# Patient Record
Sex: Female | Born: 1940 | Race: White | Hispanic: No | Smoking: Never smoker
Health system: Southern US, Community
[De-identification: ages and names within clinical notes are randomized; demographics above are authoritative.]

## PROBLEM LIST (undated history)

## (undated) DIAGNOSIS — C801 Malignant (primary) neoplasm, unspecified: Secondary | ICD-10-CM

## (undated) DIAGNOSIS — E041 Nontoxic single thyroid nodule: Secondary | ICD-10-CM

## (undated) DIAGNOSIS — M25569 Pain in unspecified knee: Secondary | ICD-10-CM

## (undated) DIAGNOSIS — M1812 Unilateral primary osteoarthritis of first carpometacarpal joint, left hand: Secondary | ICD-10-CM

## (undated) DIAGNOSIS — K219 Gastro-esophageal reflux disease without esophagitis: Secondary | ICD-10-CM

## (undated) DIAGNOSIS — I700 Atherosclerosis of aorta: Secondary | ICD-10-CM

## (undated) DIAGNOSIS — M65342 Trigger finger, left ring finger: Secondary | ICD-10-CM

## (undated) DIAGNOSIS — K819 Cholecystitis, unspecified: Secondary | ICD-10-CM

## (undated) DIAGNOSIS — M67431 Ganglion, right wrist: Secondary | ICD-10-CM

## (undated) DIAGNOSIS — R1310 Dysphagia, unspecified: Secondary | ICD-10-CM

## (undated) DIAGNOSIS — R918 Other nonspecific abnormal finding of lung field: Secondary | ICD-10-CM

## (undated) DIAGNOSIS — Z8543 Personal history of malignant neoplasm of ovary: Secondary | ICD-10-CM

## (undated) DIAGNOSIS — E278 Other specified disorders of adrenal gland: Secondary | ICD-10-CM

## (undated) DIAGNOSIS — R10819 Abdominal tenderness, unspecified site: Secondary | ICD-10-CM

## (undated) DIAGNOSIS — M170 Bilateral primary osteoarthritis of knee: Secondary | ICD-10-CM

## (undated) DIAGNOSIS — K760 Fatty (change of) liver, not elsewhere classified: Secondary | ICD-10-CM

## (undated) DIAGNOSIS — R002 Palpitations: Secondary | ICD-10-CM

## (undated) DIAGNOSIS — R109 Unspecified abdominal pain: Secondary | ICD-10-CM

## (undated) DIAGNOSIS — K5900 Constipation, unspecified: Secondary | ICD-10-CM

## (undated) DIAGNOSIS — E039 Hypothyroidism, unspecified: Secondary | ICD-10-CM

## (undated) DIAGNOSIS — I10 Essential (primary) hypertension: Secondary | ICD-10-CM

## (undated) DIAGNOSIS — K623 Rectal prolapse: Secondary | ICD-10-CM

## (undated) DIAGNOSIS — M1990 Unspecified osteoarthritis, unspecified site: Secondary | ICD-10-CM

## (undated) DIAGNOSIS — D126 Benign neoplasm of colon, unspecified: Secondary | ICD-10-CM

## (undated) DIAGNOSIS — K449 Diaphragmatic hernia without obstruction or gangrene: Secondary | ICD-10-CM

## (undated) DIAGNOSIS — R1031 Right lower quadrant pain: Secondary | ICD-10-CM

## (undated) DIAGNOSIS — K439 Ventral hernia without obstruction or gangrene: Secondary | ICD-10-CM

## (undated) DIAGNOSIS — E279 Disorder of adrenal gland, unspecified: Secondary | ICD-10-CM

## (undated) DIAGNOSIS — D485 Neoplasm of uncertain behavior of skin: Secondary | ICD-10-CM

## (undated) DIAGNOSIS — E559 Vitamin D deficiency, unspecified: Secondary | ICD-10-CM

## (undated) HISTORY — DX: Ventral hernia without obstruction or gangrene: K43.9

## (undated) HISTORY — DX: Malignant (primary) neoplasm, unspecified: C80.1

## (undated) HISTORY — DX: Rectal prolapse: K62.3

## (undated) HISTORY — DX: Gastro-esophageal reflux disease without esophagitis: K21.9

## (undated) HISTORY — DX: Neoplasm of uncertain behavior of skin: D48.5

## (undated) HISTORY — DX: Trigger finger, left ring finger: M65.342

## (undated) HISTORY — DX: Ganglion, right wrist: M67.431

## (undated) HISTORY — DX: Palpitations: R00.2

## (undated) HISTORY — DX: Hypothyroidism, unspecified: E03.9

## (undated) HISTORY — DX: Atherosclerosis of aorta: I70.0

## (undated) HISTORY — DX: Pain in unspecified knee: M25.569

## (undated) HISTORY — DX: Personal history of malignant neoplasm of ovary: Z85.43

## (undated) HISTORY — DX: Unspecified osteoarthritis, unspecified site: M19.90

## (undated) HISTORY — DX: Abdominal tenderness, unspecified site: R10.819

## (undated) HISTORY — DX: Fatty (change of) liver, not elsewhere classified: K76.0

## (undated) HISTORY — DX: Dysphagia, unspecified: R13.10

## (undated) HISTORY — DX: Unspecified abdominal pain: R10.9

## (undated) HISTORY — DX: Cholecystitis, unspecified: K81.9

## (undated) HISTORY — DX: Bilateral primary osteoarthritis of knee: M17.0

## (undated) HISTORY — DX: Essential (primary) hypertension: I10

## (undated) HISTORY — DX: Other nonspecific abnormal finding of lung field: R91.8

## (undated) HISTORY — DX: Other specified disorders of adrenal gland: E27.8

## (undated) HISTORY — DX: Unilateral primary osteoarthritis of first carpometacarpal joint, left hand: M18.12

## (undated) HISTORY — DX: Disorder of adrenal gland, unspecified: E27.9

## (undated) HISTORY — DX: Diaphragmatic hernia without obstruction or gangrene: K44.9

## (undated) HISTORY — DX: Nontoxic single thyroid nodule: E04.1

## (undated) HISTORY — DX: Vitamin D deficiency, unspecified: E55.9

## (undated) HISTORY — DX: Benign neoplasm of colon, unspecified: D12.6

## (undated) HISTORY — DX: Right lower quadrant pain: R10.31

## (undated) HISTORY — DX: Constipation, unspecified: K59.00

---

## 1989-03-22 HISTORY — PX: ABDOMINAL HYSTERECTOMY: SHX81

## 2000-01-15 ENCOUNTER — Encounter: Admission: RE | Admit: 2000-01-15 | Discharge: 2000-01-15 | Payer: Self-pay | Admitting: Family Medicine

## 2000-01-15 ENCOUNTER — Encounter: Payer: Self-pay | Admitting: Family Medicine

## 2000-02-13 ENCOUNTER — Ambulatory Visit (HOSPITAL_COMMUNITY): Admission: RE | Admit: 2000-02-13 | Discharge: 2000-02-13 | Payer: Self-pay | Admitting: Gastroenterology

## 2000-02-13 ENCOUNTER — Encounter: Payer: Self-pay | Admitting: Gastroenterology

## 2001-01-19 ENCOUNTER — Encounter: Payer: Self-pay | Admitting: Family Medicine

## 2001-01-19 ENCOUNTER — Encounter: Admission: RE | Admit: 2001-01-19 | Discharge: 2001-01-19 | Payer: Self-pay | Admitting: Family Medicine

## 2001-02-25 ENCOUNTER — Encounter: Admission: RE | Admit: 2001-02-25 | Discharge: 2001-02-25 | Payer: Self-pay | Admitting: Family Medicine

## 2001-02-25 ENCOUNTER — Encounter: Payer: Self-pay | Admitting: Family Medicine

## 2001-07-14 ENCOUNTER — Ambulatory Visit (HOSPITAL_COMMUNITY): Admission: RE | Admit: 2001-07-14 | Discharge: 2001-07-14 | Payer: Self-pay | Admitting: Gastroenterology

## 2002-12-21 HISTORY — PX: HERNIA REPAIR: SHX51

## 2002-12-24 ENCOUNTER — Encounter: Payer: Self-pay | Admitting: General Surgery

## 2002-12-28 ENCOUNTER — Encounter: Payer: Self-pay | Admitting: Family Medicine

## 2002-12-28 ENCOUNTER — Encounter: Admission: RE | Admit: 2002-12-28 | Discharge: 2002-12-28 | Payer: Self-pay | Admitting: Family Medicine

## 2002-12-29 ENCOUNTER — Ambulatory Visit (HOSPITAL_COMMUNITY): Admission: RE | Admit: 2002-12-29 | Discharge: 2002-12-29 | Payer: Self-pay | Admitting: General Surgery

## 2003-07-19 ENCOUNTER — Encounter: Admission: RE | Admit: 2003-07-19 | Discharge: 2003-07-19 | Payer: Self-pay | Admitting: Family Medicine

## 2003-07-21 ENCOUNTER — Encounter: Admission: RE | Admit: 2003-07-21 | Discharge: 2003-07-21 | Payer: Self-pay | Admitting: Family Medicine

## 2003-07-23 HISTORY — PX: OOPHORECTOMY: SHX86

## 2003-07-27 ENCOUNTER — Ambulatory Visit: Admission: RE | Admit: 2003-07-27 | Discharge: 2003-07-27 | Payer: Self-pay | Admitting: Gynecology

## 2003-08-02 ENCOUNTER — Inpatient Hospital Stay (HOSPITAL_COMMUNITY): Admission: RE | Admit: 2003-08-02 | Discharge: 2003-08-05 | Payer: Self-pay | Admitting: Gynecology

## 2003-08-02 ENCOUNTER — Encounter (INDEPENDENT_AMBULATORY_CARE_PROVIDER_SITE_OTHER): Payer: Self-pay | Admitting: Specialist

## 2003-08-09 ENCOUNTER — Ambulatory Visit: Admission: RE | Admit: 2003-08-09 | Discharge: 2003-08-09 | Payer: Self-pay | Admitting: Gynecology

## 2003-09-21 ENCOUNTER — Ambulatory Visit: Admission: RE | Admit: 2003-09-21 | Discharge: 2003-09-21 | Payer: Self-pay | Admitting: Gynecology

## 2003-12-04 ENCOUNTER — Emergency Department (HOSPITAL_COMMUNITY): Admission: EM | Admit: 2003-12-04 | Discharge: 2003-12-04 | Payer: Self-pay | Admitting: Emergency Medicine

## 2003-12-20 ENCOUNTER — Ambulatory Visit: Admission: RE | Admit: 2003-12-20 | Discharge: 2003-12-20 | Payer: Self-pay | Admitting: Gynecology

## 2003-12-30 ENCOUNTER — Encounter: Admission: RE | Admit: 2003-12-30 | Discharge: 2003-12-30 | Payer: Self-pay | Admitting: Family Medicine

## 2004-12-31 ENCOUNTER — Encounter: Admission: RE | Admit: 2004-12-31 | Discharge: 2004-12-31 | Payer: Self-pay | Admitting: Family Medicine

## 2005-09-03 ENCOUNTER — Encounter: Admission: RE | Admit: 2005-09-03 | Discharge: 2005-09-03 | Payer: Self-pay | Admitting: Family Medicine

## 2005-12-31 ENCOUNTER — Encounter: Admission: RE | Admit: 2005-12-31 | Discharge: 2005-12-31 | Payer: Self-pay | Admitting: Family Medicine

## 2006-01-17 ENCOUNTER — Encounter: Admission: RE | Admit: 2006-01-17 | Discharge: 2006-01-17 | Payer: Self-pay | Admitting: Family Medicine

## 2007-01-02 ENCOUNTER — Encounter: Admission: RE | Admit: 2007-01-02 | Discharge: 2007-01-02 | Payer: Self-pay | Admitting: Family Medicine

## 2008-08-05 ENCOUNTER — Ambulatory Visit: Admission: RE | Admit: 2008-08-05 | Discharge: 2008-08-05 | Payer: Self-pay | Admitting: Gynecology

## 2009-05-17 ENCOUNTER — Encounter: Admission: RE | Admit: 2009-05-17 | Discharge: 2009-05-17 | Payer: Self-pay | Admitting: General Surgery

## 2009-10-30 ENCOUNTER — Encounter: Admission: RE | Admit: 2009-10-30 | Discharge: 2009-10-30 | Payer: Self-pay | Admitting: Internal Medicine

## 2010-10-19 ENCOUNTER — Other Ambulatory Visit: Payer: Self-pay | Admitting: Internal Medicine

## 2010-10-19 DIAGNOSIS — Z1231 Encounter for screening mammogram for malignant neoplasm of breast: Secondary | ICD-10-CM

## 2010-11-05 ENCOUNTER — Ambulatory Visit
Admission: RE | Admit: 2010-11-05 | Discharge: 2010-11-05 | Disposition: A | Discharge status: Home / Self Care | Payer: Medicare Other | Source: Ambulatory Visit | Attending: Internal Medicine | Admitting: Internal Medicine

## 2010-11-05 DIAGNOSIS — Z1231 Encounter for screening mammogram for malignant neoplasm of breast: Secondary | ICD-10-CM

## 2010-12-04 NOTE — Consult Note (Signed)
NAME:  Grace [name], Grace [name] NO.:  [id number]   MEDICAL RECORD NO.:  [id number]          PATIENT TYPE:  OUT   LOCATION:  GYN                          FACILITY:  [facility]   PHYSICIAN:  De Blanch, M.D.DATE OF BIRTH:  12-01-1940   DATE OF CONSULTATION:  08/05/2008  DATE OF DISCHARGE:  08/05/2008                                 CONSULTATION   The patient returns today for follow-up of an ovarian cancer initially  diagnosed in January 2005.  At that time she was found to have a complex  pelvic mass and a CA-125 a 48 units/mL.  She subsequently underwent  surgical resection of a stage III ovarian cancer on August 02, 2003.  No gross residual disease remained.  In follow-up, the patient was  recommended to receive carboplatin and Taxol chemotherapy as an adjunct.  She refused to take chemotherapy and was seen on several occasions  subsequently and each time did not wish to have new chemotherapy.  She  returns today for a 5-year checkup.  She was asked to get a CT scan  and a CA-125 prior to the visit, which are pending at this time.   PAST MEDICAL HISTORY:  Medical illnesses - none.   PAST SURGICAL HISTORY:  Ovarian cancer resection and staging and  debulking.   DRUG ALLERGIES:  None.   REVIEW OF SYSTEMS:  A 10-point comprehensive review of systems negative  except as noted above.   FAMILY HISTORY:  Negative for gynecologic, breast or colon cancer.   PHYSICAL EXAMINATION:  VITAL SIGNS:  Weight 172 pounds, blood pressure  130/70, pulse 80, respiratory rate 20.  GENERAL:  The patient is a healthy white female in no acute distress.  HEENT:  Negative.  NECK:  Supple without thyromegaly.  There is no supraclavicular or  inguinal adenopathy.  ABDOMEN:  Soft, nontender.  No mass, organomegaly, ascites or hernias  noted.  PELVIC:  EG, BUS, vagina, bladder and urethra are normal.  Cervix and  uterus surgically absent.  Adnexa without masses.  Rectovaginal exam  confirms.  LOWER EXTREMITIES:  Without edema or varicosities.   LABORATORY DATA:  The patient's CT scan became available at the end of  this visit and it is reported as showing no evidence of disease.  The CA-  125 value is 9.9 units/mL.   The patient seems to be clinically free of disease after having  undergone surgical resection of ovarian cancer, but did not receive any  chemotherapy.   The patient requests that she be followed by her primary physician, Dr.  Kristen Loader.  I would suggest that she have annual CA-125 values.  We would be  happy to see her back in follow-up if she wished.      De Blanch, M.D.  Electronically Signed     DC/MEDQ  D:  08/09/2008  T:  08/09/2008  Job:  [id number]   cc:   Ceasar Mons, R.N.  [address]  [place], [place] [address]

## 2010-12-07 NOTE — Op Note (Signed)
NAMEDELINA, [name] NO.:  [id number]   MEDICAL RECORD NO.:  [id number]                   PATIENT TYPE:  OIB   LOCATION:  2899                                 FACILITY:  [facility]   PHYSICIAN:  Angelia Mould. Derrell Lolling, M.D.             DATE OF BIRTH:  15-Feb-1941   DATE OF PROCEDURE:  12/29/2002  DATE OF DISCHARGE:                                 OPERATIVE REPORT   PREOPERATIVE DIAGNOSIS:  Umbilical hernia.   POSTOPERATIVE DIAGNOSIS:  Umbilical hernia.   OPERATION PERFORMED:  Umbilical herniorrhaphy.   SURGEON:  Angelia Mould. Derrell Lolling, M.D.   ANESTHESIA:  General.   INDICATIONS FOR PROCEDURE:  The patient is a 70 year old white female who  has noticed a small painful bulge at her umbilicus for about three months.  This is becoming more of an interference in her daily activities.  She has  no prior surgical history on the abdominal wall.  On exam she has a small  umbilical hernia which seems to be somewhat reducible.  The defect in the  fascia appears to be about 1 cm to 1.5 cm.  She is brought to the operating  room electively.   DESCRIPTION OF PROCEDURE:  Following the induction of general endotracheal  anesthesia, the patient's abdomen was prepped and draped in a sterile  fashion.  0.5% Marcaine with epinephrine was used as a local infiltration  anesthetic.  She was given a single dose of intravenous Ancef.  A curved  transverse incision was made at the lower rim of the umbilicus.  Dissection  was carried down through the subcutaneous tissue to the abdominal wall  fascia.  The umbilicus was dissected away, dissecting the umbilical skin  away from the hernia defect.  We debrided some fatty tissue in the area and  defined the hernia defect.  The hole in the fascia was about 1.5 cm.  There  was no evidence of intestinal involvement.  Small bleeders were controlled  with electrocautery.  I inserted my finger into the defect and felt  circumferentially and  felt no other defects.  We closed the fascial defect  with interrupted sutures of #1 Novofil.  Centrally, I placed a single suture  of #1 Novofil in a vest over pants fashion to overlap the fascia about 2 cm.  I then placed simple sutures at the corners to close the fascia at the  corners and then tied all of these sutures down.  I placed a couple of other  sutures of #1 Novofil to tack down the upper rim of the fascia to the fascia  below.  This provided a very secure overlapping repair.  Hemostasis was  excellent and achieved with electrocautery.  The wound was irrigated with  saline.  The umbilicus was tacked back to the fascia with a 3-0 Vicryl  suture.  The subcutaneous tissue was closed with interrupted sutures of 3-0  Vicryl.  The  skin was closed with a running  subcuticular suture of 4-0 Vicryl and Steri-Strips.  Clean bandages were  placed and the patient taken to the recovery room in stable condition. The  estimated blood loss was about 10mL.  Complications were none.  Sponge,  needle and instrument counts were correct.                                                Angelia Mould. Derrell Lolling, M.D.    HMI/MEDQ  D:  12/29/2002  T:  12/29/2002  Job:  [id number]   cc:   Otilio Connors. [name], M.D.  [address]  [place]  [place] [address]  Fax: [phone]

## 2010-12-07 NOTE — Op Note (Signed)
NAME:  Grace [name], Grace [name]                    ACCOUNT NO.:  [id number]   MEDICAL RECORD NO.:  [id number]                   PATIENT TYPE:  INP   LOCATION:  0457                                 FACILITY:  [facility]   PHYSICIAN:  De Blanch, M.D.         DATE OF BIRTH:  10-Apr-1941   DATE OF PROCEDURE:  08/02/2003  DATE OF DISCHARGE:                                 OPERATIVE REPORT   PREOPERATIVE DIAGNOSIS:  Complex pelvic mass.   POSTOPERATIVE DIAGNOSES:  1. Stage III-C ovarian cancer (completely debulked).  2. Retroperitoneal fibrosis.   PROCEDURE:  Radical debulking of ovarian cancer, including bilateral  salpingo-oophorectomy, omentectomy and ureterolysis.   SURGEON:  De Blanch, M.D.   ASSISTANT:  Laqueta Linden, M.D.  Telford Nab, R.N.   ANESTHESIA:  General with orotracheal tube.   ESTIMATED BLOOD LOSS:  200 cc.   SURGICAL FINDINGS:  At the time of exploratory laparotomy the upper abdomen,  including the diaphragm, liver, spleen, stomach, small and large bowel were  normal except for serosal involvement of the sigmoid colon.  The appendix  appeared normal.  There was an approximately  5 cm single solitary metastasis in the omentum.  In the pelvis a complex  cystic mass is arising from the left ovary that contained brown fluid.  The  right tube and ovary were confluent with the left tube and ovary, and  adherent to the pelvic peritoneum, vaginal cuff, and sigmoid colon mesentery  and serosa.  At completion of the surgical procedure there was no gross  residual disease, and no enlarged pelvic or periaortic lymph nodes.   DESCRIPTION OF PROCEDURE:  The patient was brought to the operating room and  after satisfactory attainment of general anesthesia, was placed in a  modified lithotomy position in S[place]eston stirrups.  The anterior abdominal wall,  perineum and vagina were prepped with Betadine, Foley catheter was placed,  the patient was draped.  The  abdomen was entered through a low midline  paramedian incision.  Peritoneal washings were obtained, then the upper  abdomen and pelvis were explored at the above-noted findings.  A Buchwalter  retractor was positioned and the omentum brought into the operative field,  with an obvious metastasis present.  Omentum was removed from its attachment  to the transverse colon, using a combination of cautery and ligatures on  vascular pedicles.  All gross omental disease is removed.  All pedicles are  inspected and found to be hemostatic.   The retractor was repositioned; the small bowel and colon were packed out of  the pelvis.  The left retroperitoneal space was opened by dividing the round  ligament and lateral sidewall peritoneum.  The vessels on the pelvic  sidewall as well as the ureter were identified.  The ovarian vessels were  skeletonized, clamped, cut, free tied and suture ligated.  The ureter was  identified and found to be densely fibrotic and adherent to the mass.  In  order to completely  excise the mass, a retroperitoneal dissection progressed  to develop the perivesical and perirectal spaces.  The ureter was then freed  from its attachments to the fibrotic retroperitoneum, performing a  ureterolysis to nearly the end of the pelvic rim to the insertion of the  ureter in the bladder.  The more distal portion of the dissection in the  uterine vessels were encountered, clamped cut and suture ligated.  With the  ureter mobilized laterally, approached the mass from the medial aspect  incising the peritoneum and the attachments of the sigmoid colon.  Likewise,  peritoneum overlying the bladder flap and vagina was incised.  In order to  gain further mobility, we opened up the right pelvic sidewall by dividing  the right round ligament and lateral peritoneum.  The pelvic sidewall  vessels and ureter were identified.  The ovarian vessels were skeletonized,  clamped, cut and suture ligated.   Careful dissection was performed beneath  the right tube and ovary, incising the posterior leaf of the broad ligament  and mobilizing the right tube and ovary to their attachments on the vaginal  cuff.  A bimanual examination was then performed in the vagina, in order to  identify the vaginal cuff.  The vaginal cuff was mobilized away from the  bladder and then grasped with the Kocher clamp.  The rectovaginal septum was  then developed, in order to further mobilize the mass away from the vaginal  cuff.  The uterosacral ligaments were isolated, clamped, cut and suture  ligated.  We continued dissection of the attachments of the mass to the  peritoneum of the sigmoid colon and its mesentery, and deep into the  posterior cul-de-sac being accomplished (using predominately cautery,  occasional suture ligature or clip as needed).  Ultimately, the left tube  and ovary and right tube and ovary were removed all as one specimen.  The  pelvis was then explored and hemostasis achieved with cautery and Hemoclips.   Proctoscopy was performed to make certain there was no leak in the sigmoid  colon, in that a bubble test was performed and no air was noted to come  through the saline placed in the pelvis.  Frozen section returned showing  this to be a malignancy of the ovary, most likely a serous carcinoma.  The  pelvis was reinspected and no lesions were noted.  The small bowel was run  from the ligament of Treitz to the cecum, and no metastases were noted on  the serosa or mesentery.  At this point in the procedure it was felt that  all gross disease had been debulked and that no further staging would be  changed by performing a lymphadenectomy.  Therefore, packs and retractors  were removed and the anterior abdominal wall was closed in layers; the first  being a running mass closure using #1 PDS.  The subcutaneous tissue was  irrigated.  Hemostasis was achieved with cautery and the subcutaneous  tissue reapproximated with interrupted 3-0 Vicryl sutures.  The skin was closed  with skin staples.  A dressing was applied.  The patient was awakened from  anesthesia and taken to recovery room in satisfactory condition.  Sponge,  needle and instrument count was correct x2.                                               Reuel Boom  Stanford Breed, M.D.    DC/MEDQ  D:  08/02/2003  T:  08/02/2003  Job:  [id number]   cc:   Laqueta Linden, M.D.  [address]  [place]  [place] [address]  Fax: [phone]   Telford Nab, R.N.  [address]  [place], [place] [address]   Carola J. [name], M.D.  [address]  [place]  [place] [address]  Fax: [phone]

## 2010-12-07 NOTE — Procedures (Signed)
[facility]  Patient:    Grace [name], Grace [name] Visit Number: [id number] MRN: [id number]          Service Type: Attending:  Verlin Grills, M.D. Dictated by:   Verlin Grills, M.D. Proc. Date: 07/14/01   CC:         Carola J. [name], M.D.   Procedure Report  DATE OF BIRTH:  [birth year]  REFERRING PHYSICIAN:  Otilio Connors. [name], M.D.  PROCEDURE PERFORMED:  Colonoscopy.  ENDOSCOPIST:  Verlin Grills, M.D.  INDICATIONS FOR PROCEDURE:  The patient is a 70 year old female with unexplained anemia and hemoccult negative stool.  February 09, 2000, esophagogastroduodenoscopy was normal.  February 25, 2001 barium swallow x-ray revealed a moderate-sized sliding hiatal hernia with gastroesophageal reflux.  I discussed with the patient the complications associated with colonoscopy and polypectomy including a 15 per 1000 risk of bleeding and 4 per 1000 risk of colonic perforation requiring surgical repair.  The patient has signed the operative permit.  PREMEDICATION:  Versed 7.5 mg, fentanyl 50 mcg.  ENDOSCOPE:  Olympus pediatric video colonoscope.  DESCRIPTION OF PROCEDURE:  After obtaining informed consent, the patient was placed in the left lateral decubitus position.  I administered intravenous fentanyl and intravenous Versed to achieve conscious sedation for the procedure.  The patients blood pressure, oxygen saturation and cardiac rhythm were monitored throughout the procedure and documented in the medical record.  Anal inspection was normal.  Digital rectal exam was normal.  The Olympus pediatric video colonoscope was then introduced into the rectum and easily advanced to the cecum.  Colonic preparation for the exam today was excellent.  Rectum:  Normal.  Sigmoid colon and descending colon:  Normal.  Splenic flexure:  Normal.  Transverse colon:  Normal.  Hepatic flexure:  Normal.  Ascending colon:   Normal.  Cecum and ileocecal valve:  Normal.  ASSESSMENT:  Normal proctocolonoscopy to the cecum.  No endoscopic evidence of for the presence of colorectal neoplasia or bleeding from the colon or rectum. Dictated by:   Verlin Grills, M.D. Attending:  Verlin Grills, M.D. DD:  07/14/01 TD:  07/14/01 Job: [id number]

## 2010-12-07 NOTE — Discharge Summary (Signed)
NAME:  Grace [name], MELBERG                    ACCOUNT NO.:  [id number]   MEDICAL RECORD NO.:  [id number]                   PATIENT TYPE:  OUT   LOCATION:  GYN                                  FACILITY:  [facility]   PHYSICIAN:  Laqueta Linden, M.D.                 DATE OF BIRTH:  1940/08/24   DATE OF ADMISSION:  08/09/2003  DATE OF DISCHARGE:  08/09/2003                                 DISCHARGE SUMMARY   No dictation.                                               Laqueta Linden, M.D.    LKS/MEDQ  D:  08/30/2003  T:  08/30/2003  Job:  [id number]   cc:   Telford Nab, R.N.  [address]  [place], [place] [address]   Carola J. [name], M.D.  [address]  [place]  [place] [address]  Fax: [phone]

## 2010-12-07 NOTE — Discharge Summary (Signed)
NAME:  Grace [name], Grace [name]                    ACCOUNT NO.:  [id number]   MEDICAL RECORD NO.:  [id number]                   PATIENT TYPE:  INP   LOCATION:  0457                                 FACILITY:  [facility]   PHYSICIAN:  Laqueta Linden, M.D.                 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   SECONDARY DIAGNOSES:  1. Hypertension.  2. Anemia.  3. Gastroesophageal reflux disease.   PROCEDURES:  Radical debulking of ovarian cancer with a laparotomy with  bilateral salpingo-oophorectomy, omentectomy, ureterolysis.   COMPLICATIONS:  None.   TRANSFUSIONS:  None.   CONSULTATIONS:  None.   HISTORY OF PRESENT ILLNESS:  Grace [name] is a 70 year old female, status  post vaginal hysterectomy in the distant past, who was referred by Dr.  [name] for a CT scan revealing a complex pelvic mass with a slightly  elevated CA-125 of 48.  She was seen initially by Dr. Katrinka Blazing and then in  consultation with Dr. Stanford Breed who concurred that this was likely a  malignant ovarian tumor.  The patient was scheduled for exploratory  laparotomy and debulking and staging as indicated.  Please see dictated  history and physical for full details of the history of present illness,  past history, social history, family history, examination, and laboratory  studies on admission.   HOSPITAL COURSE:  The patient was admitted on August 02, 2003, for same day  surgery, and underwent the above noted procedure after undergoing a  mechanical bowel prep.  Postoperatively, she was quite stable.  She had some  initial low urinary output felt to be due to lack of adequate hydration, but  this responded well to increase in fluids.  Her postoperative hemoglobin was  10.3 on postoperative day #1, with normal  electrolytes.  She aggressively  ambulated and had a swift return of bowel function with passage of flatus on  postoperative day #1.  Her diet was advanced and she was doing quite well by  postoperative day #2, tolerating a general diet with active bowel sounds and  a non-distended abdomen.  She continued to ambulate and void without  difficulty and maintained stable vital signs, and was discharged home the  morning of postoperative day #3 in improved condition.  She will be seen in  followup by Dr. De Blanch for removal of staples and  consultation regarding adjuvant chemotherapy for  her ovarian cancer.  She was discharged home in improved condition.  She was  given a prescription for Percocet  5/325 mg dispense 20, one to two q.4-6h. p.r.n. pain with no refills.  Also,  all routine discharge instructions by myself and reinforced by Telford Nab.  She is discharged home in improved condition.  Laqueta Linden, M.D.    LKS/MEDQ  D:  08/30/2003  T:  08/30/2003  Job:  [id number]   cc:   Telford Nab, R.N.  [address]  [place], [place] [address]   Carola J. [name], M.D.  [address]  [place]  [place] [address]  Fax: [phone]   De Blanch, M.D.

## 2010-12-07 NOTE — Consult Note (Signed)
NAME:  Grace [name], Grace [name]                    ACCOUNT NO.:  [id number]   MEDICAL RECORD NO.:  [id number]                   PATIENT TYPE:  OUT   LOCATION:  GYN                                  FACILITY:  [facility]   PHYSICIAN:  De Blanch, M.D.         DATE OF BIRTH:  [birth year]   DATE OF CONSULTATION:  DATE OF DISCHARGE:                                   CONSULTATION   A 70 year old white female seen in consultation, at the request of Dr. Laqueta Linden, regarding a complex pelvic mass.  The patient was in her usual  state of health until recently when she developed some pelvic pain.  She was  seen by her primary physician, Dr. Otilio Connors. [name], who obtained a CT  scan and an ultrasound showing a complex pelvic mass.  A CA-125 also was  obtained, and it was elevated at 48 units/ml.  The patient has no other GI  or GU symptoms.  Her functional status has been excellent, and she has no  weight loss.   PAST MEDICAL HISTORY:   MEDICAL ILLNESSES:  1. Hypertension.  2. Anemia.  3. Gastroesophageal reflux disease.   PAST SURGICAL HISTORY:  1. Vaginal hysterectomy.  2. Umbilical herinorrhaphy, June 2004 (without mesh).  3. Arm fracture with closed reduction.   OBSTETRICAL HISTORY:  Gravida 2.   FAMILY HISTORY:  Negative for gynecologic, breast or colon cancer.   REVIEW OF SYSTEMS:  Notes the patient has lost 20 pounds, but she has been  on a weight reduction diet.   CURRENT MEDICATIONS:  Premarin 0.625 mg daily. calcium, Toprol XL,  Lisinopril, hydrochlorothiazide, aspirin, garlic, vitamin B-12, vitamin E,  vitamin C.   PHYSICAL EXAMINATION:  VITAL SIGNS:  Weight 164 pounds, height 5 foot 1,  blood pressure 168/98, pulse 84, respiratory rate 24.  GENERAL:  The patient is a healthy white female in no acute distress.  HEENT:  Negative.  NECK:  Supple without thyromegaly.  There is no supraclavicular or inguinal  adenopathy.  ABDOMEN:  Soft, nontender.  No mass,  organomegaly, ascites, or hernias  noted.  PELVIC:  EG, BUS, vagina, bladder and urethra are normal and well supported.  On bimanual exam there is a multi-nodular mass measuring approximately 8-cm  in aggregate diameter.  This seems to be somewhat bi-lobed.  Rectovaginal  exam confirms.  The mass seems to be attached to the cul-de-sac and vaginal  cuff.   IMPRESSION:  A complex pelvic mass with slight elevated CA-125.  I think  this is most likely a malignant ovarian tumor, although certainly a benign  fibroadenoma could appear with this clinical scenario.   I have recommended the patient undergo exploratory laparotomy with plans for  tumor resection, intraoperative frozen section and staging and de-bulking if  necessary.  The risks of surgery including hemorrhage, infection, injury to  adjacent viscera, thromboembolic complication to anesthetic risks were all  discussed with the patient.  All of her questions  were answered, and she  wishes to proceed with surgery as soon as we can coordinate it in  conjunction with Dr. Katrinka Blazing.                                               De Blanch, M.D.    DC/MEDQ  D:  07/27/2003  T:  07/27/2003  Job:  [id number]   cc:   Laqueta Linden, M.D.  [address]  [place]  [place] [address]  Fax: [phone]   Otilio Connors. [name], M.D.  [address]  [place]  [place] [address]  Fax: [phone]   Telford Nab, R.N.  [address]  [place], [place] [address]

## 2010-12-07 NOTE — Consult Note (Signed)
NAME:  Grace [name], Grace [name]                    ACCOUNT NO.:  [id number]   MEDICAL RECORD NO.:  [id number]                   PATIENT TYPE:  OUT   LOCATION:  GYN                                  FACILITY:  [facility]   PHYSICIAN:  De Blanch, M.D.         DATE OF BIRTH:  1941/03/17   DATE OF CONSULTATION:  08/09/2003  DATE OF DISCHARGE:                                   CONSULTATION   This 70 year old white female is seen for postoperative treatment  counseling.  She underwent an exploratory laparotomy and radical debulking  of ovarian cancer with complete surgical resection on August 02, 2003.  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  The primary tumor was 14.5 cm in greatest dimension.  There  was a 3-cm omental metastasis which was completely resected.  No other  evidence of gross disease was found at the time of surgery.  The patient has  had an uncomplicated postoperative course.  Staples were removed earlier  today.  Her wound is healing nicely.   PHYSICAL EXAM:  VITAL SIGNS:  Weight 163 pounds.  ABDOMEN:  Soft, nontender.  No masses, organomegaly, ascites, or hernias are  noted.  Midline incision is healing well.   IMPRESSION:  Stage IIIC ovarian cancer, completely resected.  The patient is  having a good postoperative recovery.   I had a lengthy discussion with the patient regarding the natural history of  this disease and our recommendation that she proceed with chemotherapy using  a combination of carboplatin and Taxol.  Currently, there are no clinical  trials available for her to consider, and we will, therefore, treat her off  protocol using carboplatin and Taxol.  She was given an appointment to see  Dr. Jama Flavors.  We did discuss the pros and cons of chemotherapy and  most of its side effects today.  The patient will return to see me for a 6-  week postoperative checkup or sooner as needed.                                     De Blanch, M.D.    DC/MEDQ  D:  08/09/2003  T:  08/10/2003  Job:  [id number]   cc:   Laqueta Linden, M.D.  [address]  [place]  [place] [address]  Fax: [phone]   Lennis P. Darrold Span, M.D.  [address] [facility]  [place]  [place] [address]  Fax: [phone]   Telford Nab, R.N.  [phone] N. [address]  [place], [place] [address]

## 2010-12-07 NOTE — Consult Note (Signed)
NAME:  Grace [name], Grace [name]                    ACCOUNT NO.:  [id number]   MEDICAL RECORD NO.:  [id number]                   PATIENT TYPE:  OUT   LOCATION:  GYN                                  FACILITY:  [facility]   PHYSICIAN:  De Blanch, M.D.         DATE OF BIRTH:  04-Dec-1940   DATE OF CONSULTATION:  12/20/2003  DATE OF DISCHARGE:                                   CONSULTATION   A 70 year old white female returns for continuing followup.  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  She  underwent surgical resection in January, 2005 with complete gross resection  disease.  Postoperatively, carboplatin and Taxol chemotherapy were advised,  but the patient refused any active therapy.   Since her last visit, she has done well.  She denies any GI or GU symptoms.  She has no pelvic pain or pressure.  She has no bleeding or discharge.  Functional status is excellent.   PAST MEDICAL HISTORY:  1. Hypertension.  2. Anemia.  3. Gastroesophageal reflux disease.   PAST SURGICAL HISTORY:  1. Vaginal hysterectomy.  2. Umbilical herniorrhaphy.  3. Arm fracture with closed reduction.  4. Ovarian cancer debulking.   OBSTETRICAL HISTORY:  Gravida 2.   FAMILY HISTORY:  Negative for gynecologic, breast, or colon cancers.   CURRENT MEDICATIONS:  Premarin.  Calcium.  Toprol.  Lisinopril.  Hydrochlorothiazide.  Aspirin.  Garlic.  Vitamin B12.  Vitamin E.  Vitamin  C.   REVIEW OF SYSTEMS:  Negative except as noted above.   PHYSICAL EXAMINATION:  VITAL SIGNS:  Weight is 162 pounds.  GENERAL:  The patient is a healthy white female in no acute distress.  HEENT:  Negative.  NECK:  Supple without thyromegaly.  There is no supraclavicular or inguinal  adenopathy.  ABDOMEN:  Soft and nontender.  No mass, organomegaly, ascites, or hernias  are noted.  PELVIC:  EG/BUS, vagina, bladder, and urethra are normal.  The vaginal cuff  is well healed and well  supported.  No lesions are noted.  Bimanual and  rectovaginal exam reveal no masses, induration, or nodularity.   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  Status post  complete resection, January, 2005.   The patient continues to refuse conventional chemotherapy.  She does have a  number of questions about alternative drugs, which I know nothing about, and  I expressed my skepticism that they would be helpful.  The patient continues  to believe that she has been cured by her faith.   She would like to continue to be monitored closely.  We will therefore see  her back again in three months.  It is noted that she recently had a CA-125  value which was 8.5 U/ml.  De Blanch, M.D.    DC/MEDQ  D:  12/20/2003  T:  12/20/2003  Job:  [id number]   cc:   Otilio Connors. [name], M.D.  [address]  [place]  [place] [address]  Fax: [phone]   Laqueta [place]en, M.D.  [address]  [place]  [place] [address]  Fax: [phone]   Telford Nab, R.N.  [phone] N. [address]  [place], [place] [address]

## 2010-12-07 NOTE — Consult Note (Signed)
NAME:  Grace [name], Grace [name]                    ACCOUNT NO.:  [id number]   MEDICAL RECORD NO.:  [id number]                   PATIENT TYPE:  OUT   LOCATION:  GYN                                  [facility]ACILITY[facility]  [facility]   PHYSICIAN:  De Blanch, M.D.         DATE OF BIRTH:  [birth year]   DATE OF CONSULTATION:  09/21/2003  DATE OF DISCHARGE:                                   CONSULTATION   REASON FOR CONSULTATION:  A 70 year old white female returns for a 6-week  postoperative checkup having undergone radical debulking of an ovarian  cancer with bilateral salpingo-oophorectomy, omentectomy, and ureterolysis  on August 02, 2003.  She had a stage IIIC serous carcinoma arising from the  ovary with metastasis to the omentum.  At the completion of the surgical  procedure there was no gross disease.  The patient has had an uncomplicated  postoperative course.   In consultation following surgery, I recommended the patient receive  adjuvant chemotherapy and the patient subsequently saw Jama Flavors, M.D.  on August 22, 2003.  The patient subsequently decided that she was cured of  her disease based on prayer and therefore did not wish to have any adjuvant  therapy.  She expressed this same opinion to me today.   From a medical point of view,  she denies any GI or GU symptoms.  Has no  pelvic pain, pressure, vaginal bleeding or discharge.  Functional status is  very good.   PHYSICAL EXAMINATION:  VITAL SIGNS:  Weight 161 pounds, blood pressure  134/86.  GENERAL:  The patient is a healthy white female in no acute distress.  HEENT:  Negative.  NECK:  Supple without thyromegaly.  LYMPHATICS:  There is no supraclavicular or inguinal adenopathy.  ABDOMEN:  Soft, nontender.  No masses, organomegaly, ascites, or hernias are  noted.  A midline incision is well healed.  PELVIC:  EGBUS, vagina, bladder, urethra are normal.  Vaginal cuff is well  healed.  No lesions are noted.  Bimanual and  rectovaginal exam reveals no  masses, induration, or nodularity.   IMPRESSION:  Stage IIIC ovarian cancer completely resected.  The patient  declines our recommended therapy to be given as a adjunct to her surgical  resection.  The risks of recurrence were explained to the patient but she  still does not wish to have any significant therapy.   I believe the patient has informed consent and is making this decision  against my advice.  She agrees to return in 3 months for continuing follow  up and I have given her a list of warning signs and signals.  We will obtain  a CA-125 at our next visit as well.                                               De Blanch,  M.D.    DC/MEDQ  D:  09/21/2003  T:  09/22/2003  Job:  [id number]   cc:   Otilio Connors. [name], M.D.  [address]  [place]  [place] [address]  Fax: [phone]   Laqueta Lind[place], M.D.  [address]  [place]  [place] [address]  Fax: [phone]   Telford Nab, R.N.  [phone] N[address]  [place], [place] [address]   Lennis P. Darrold Span, M.D.  [address] [facility]  [place]  [place] [address]  Fax: [phone]

## 2011-10-02 ENCOUNTER — Other Ambulatory Visit: Payer: Self-pay | Admitting: Internal Medicine

## 2011-10-02 DIAGNOSIS — Z1231 Encounter for screening mammogram for malignant neoplasm of breast: Secondary | ICD-10-CM

## 2011-11-06 ENCOUNTER — Ambulatory Visit
Admission: RE | Admit: 2011-11-06 | Discharge: 2011-11-06 | Disposition: A | Discharge status: Home / Self Care | Payer: Medicare Other | Source: Ambulatory Visit | Attending: Internal Medicine | Admitting: Internal Medicine

## 2011-11-06 DIAGNOSIS — Z1231 Encounter for screening mammogram for malignant neoplasm of breast: Secondary | ICD-10-CM

## 2012-04-09 ENCOUNTER — Encounter (INDEPENDENT_AMBULATORY_CARE_PROVIDER_SITE_OTHER): Payer: Self-pay | Admitting: General Surgery

## 2012-04-10 ENCOUNTER — Encounter (INDEPENDENT_AMBULATORY_CARE_PROVIDER_SITE_OTHER): Payer: Self-pay | Admitting: General Surgery

## 2012-04-10 ENCOUNTER — Ambulatory Visit (INDEPENDENT_AMBULATORY_CARE_PROVIDER_SITE_OTHER): Payer: Medicare Other | Admitting: General Surgery

## 2012-04-10 ENCOUNTER — Other Ambulatory Visit (INDEPENDENT_AMBULATORY_CARE_PROVIDER_SITE_OTHER): Payer: Self-pay | Admitting: General Surgery

## 2012-04-10 VITALS — BP 156/90 | HR 74 | Temp 97.4°F | Resp 16 | Ht 62.0 in | Wt 175.2 lb

## 2012-04-10 DIAGNOSIS — R109 Unspecified abdominal pain: Secondary | ICD-10-CM

## 2012-04-10 NOTE — Progress Notes (Signed)
Patient ID: Grace [name], female   DOB: [birth year], 71 y.o.   MRN: [id number]  Chief Complaint  Patient presents with  . Hernia    Ventral    HPI Grace [name] is a 71 y.o. female.  She was referred back to me by Dr. Ninetta Lights  in [place] [facility] [place] [facility] for evaluation of abdominal pain and possible ventral hernia.  This patient has a past history of umbilical hernia repair without mesh in 2004. She also underwent laparotomy for ovarian cancer by Dr. Sandra Cockayne- Pearson in 2005 which included hysterectomy and BSO. She was followed for 5 years, last visit with Dr. Kemper Durie- Sharol Given 2010, thought to be cancer free. Most recent CA 125 is 9.82 which is normal.  She notices a bulge in her abdomen. She has some pain but is no history of incarceration nausea or vomiting or change in her bowel habits. She says that she is scared about this and wants to be evaluated, which is appropriate.I evaluated for her her for hernia in 2010, including CT scan, and could not make a diagnosis of ventral hernia.  She has known gallstones but has never had cholecystectomy. She has some mild reflux symptoms. She has some intolerance to spicy foods and states she has some burning in the mid and lower abdomen after eating spicy foods but she does not have right upper quadrant pain or back pain and does not have nausea or vomiting.  Her other comorbidities are hypothyroidism on Synthroid and hypertension, controlled. She is otherwise pretty healthy. HPI  Past Medical History  Diagnosis Date  . Arthritis   . GERD (gastroesophageal reflux disease)   . Hypertension   . Abdominal pain   . Constipation     occasional  . Ventral hernia     Past Surgical History  Procedure Date  . Abdominal hysterectomy 1990's    partial  . Hernia repair 12/2002  . Oophorectomy 07/2003    Family History  Problem Relation Age of Onset  . Heart attack Mother   . Heart attack Father     Social  History History  Substance Use Topics  . Smoking status: Never Smoker   . Smokeless tobacco: Never Used  . Alcohol Use: No    Allergies  Allergen Reactions  . Zocor (Simvastatin) Nausea And Vomiting and Other (See Comments)    Muscle weakness, extremely sick.    Current Outpatient Prescriptions  Medication Sig Dispense Refill  . Coenzyme Q10 (CO Q-10) 100 MG CAPS Take by mouth daily.      Marland Kitchen doxycycline (VIBRA-TABS) 100 MG tablet Daily.      Marland Kitchen levothyroxine (SYNTHROID, LEVOTHROID) 75 MCG tablet Daily.      Marland Kitchen lisinopril-hydrochlorothiazide (PRINZIDE,ZESTORETIC) 20-25 MG per tablet Daily.      . LUTEIN-BILBERRY PO Take by mouth daily.      . meloxicam (MOBIC) 15 MG tablet Daily.      . Misc Natural Products (OSTEO BI-FLEX TRIPLE STRENGTH PO) Take by mouth 2 (two) times daily.      . Multiple Vitamin (MULTIVITAMIN) tablet Take 1 tablet by mouth daily. One-a-day      . nebivolol (BYSTOLIC) 10 MG tablet Take 10 mg by mouth daily.      . Omega-3 Fatty Acids (OMEGA 3 PO) Take by mouth daily. Vitamin + D        Review of Systems Review of Systems  Constitutional: Negative for fever, chills and unexpected weight change.  HENT: Negative for hearing  loss, congestion, sore throat, trouble swallowing and voice change.   Eyes: Negative for visual disturbance.  Respiratory: Negative for cough and wheezing.   Cardiovascular: Negative for chest pain, palpitations and leg swelling.  Gastrointestinal: Positive for abdominal pain and abdominal distention. Negative for nausea, vomiting, diarrhea, constipation, blood in stool, anal bleeding and rectal pain.  Genitourinary: Negative for hematuria, vaginal bleeding and difficulty urinating.  Musculoskeletal: Negative for arthralgias.  Skin: Negative for rash and wound.  Neurological: Negative for seizures, syncope and headaches.  Hematological: Negative for adenopathy. Does not bruise/bleed easily.  Psychiatric/Behavioral: Negative for confusion.     Blood pressure 156/90, pulse 74, temperature 97.4 F (36.3 C), temperature source Temporal, resp. rate 16, height 5\' 2"  (1.575 m), weight 175 lb 4 oz (79.493 kg).  Physical Exam Physical Exam  Constitutional: She is oriented to person, place, and time. She appears well-developed and well-nourished. No distress.  HENT:  Head: Normocephalic and atraumatic.  Nose: Nose normal.  Mouth/Throat: No oropharyngeal exudate.  Eyes: Conjunctivae normal and EOM are normal. Pupils are equal, round, and reactive to light. Left eye exhibits no discharge. No scleral icterus.  Neck: Neck supple. No JVD present. No tracheal deviation present. No thyromegaly present.  Cardiovascular: Normal rate, regular rhythm, normal heart sounds and intact distal pulses.   No murmur heard. Pulmonary/Chest: Effort normal and breath sounds normal. No respiratory distress. She has no wheezes. She has no rales. She exhibits no tenderness.  Abdominal: Soft. Bowel sounds are normal. She exhibits no distension and no mass. There is no tenderness. There is no rebound and no guarding.       Slightly obese. Soft and nontender. She has an obvious diastasis recti with a bulge in the upper midline when she does a sit up or lift her head up off the exam table, this is not present when she is standing. There is a midline scar extending from slightly above the umbilicus on  the left down to the pubic area. There may be a slight weakness but I'm not certain in the periumbilical area. Certainly no large hernia here. She's examined and multiple ways.  Musculoskeletal: She exhibits no edema and no tenderness.  Lymphadenopathy:    She has no cervical adenopathy.  Neurological: She is alert and oriented to person, place, and time. She exhibits normal muscle tone. Coordination normal.  Skin: Skin is warm. No rash noted. She is not diaphoretic. No erythema. No pallor.  Psychiatric: She has a normal mood and affect. Her behavior is normal.  Judgment and thought content normal.    Data Reviewed My old records. Dr. Barnett Abu office notes and lab tests.  Assessment    Abdominal pain and possible periumbilical incisional hernia. Exam is not diagnostic today.  Diastases recti, this is producing an obvious bulge in the upper abdomen but is not a hernia.  Gallstones. Probably asymptomatic. I suppose we should further investigate as to whether this may be contributing to her pain  History of stage III ovarian cancer, presumably cancer free with normal CA 125  Hypertension  Hypothyroidism  Remote history of umbilical hernia without mesh 2004    Plan    To clarify what is going on, we're going to repeat her CT scan of the abdomen and pelvis with contrast, and we will also get a CCK stimulated hepatobiliary scan.  She will return to see me after these tests are done for further evaluation and advice.       Angelia Mould. Derrell Lolling, M.D., FACS  [facility] General and Minimally invasive Surgery Breast and Colorectal Surgery Office:   [phone] Pager:   [phone]  04/10/2012, 9:00 AM

## 2012-04-10 NOTE — Patient Instructions (Signed)
I am not completely sure what is causing your abdominal pain.  The large bulge in your upper abdomen is due to a condition called diastases recti, which is not a hernia. The pain in your lower abdomen might be caused by a hernia, but I cannot clearly demonstrate a hernia in the mid or lower abdomen on exam.  You also have gallstones and some spicy food intolerance but you mdo not have classic gallbladder symptoms.  You will be scheduled for a CT scan of the abdomen and pelvis and you will also be scheduled for a special gallbladder xray called a biliary scan.  Return to see Dr. Derrell Lolling after these tests are done for further discussion.

## 2012-04-13 ENCOUNTER — Ambulatory Visit
Admission: RE | Admit: 2012-04-13 | Discharge: 2012-04-13 | Disposition: A | Discharge status: Home / Self Care | Payer: Medicare Other | Source: Ambulatory Visit | Attending: General Surgery | Admitting: General Surgery

## 2012-04-13 MED ORDER — IOHEXOL 300 MG/ML  SOLN
100.0000 mL | Freq: Once | INTRAMUSCULAR | Status: AC | PRN
  Administered 2012-04-13: 100 mL via INTRAVENOUS

## 2012-04-14 ENCOUNTER — Encounter (INDEPENDENT_AMBULATORY_CARE_PROVIDER_SITE_OTHER): Payer: Self-pay

## 2012-04-14 ENCOUNTER — Telehealth (INDEPENDENT_AMBULATORY_CARE_PROVIDER_SITE_OTHER): Payer: Self-pay | Admitting: General Surgery

## 2012-04-14 NOTE — Telephone Encounter (Signed)
Called patient and advised of results from CT scan on 04/13/12. Confirmed she has another test to be performed and will be back to see Dr. Derrell Lolling on 05/07/12. Patient agreed.

## 2012-04-17 ENCOUNTER — Encounter (HOSPITAL_COMMUNITY)
Admission: RE | Admit: 2012-04-17 | Discharge: 2012-04-17 | Disposition: A | Discharge status: Home / Self Care | Payer: Medicare Other | Source: Ambulatory Visit | Attending: General Surgery | Admitting: General Surgery

## 2012-04-17 DIAGNOSIS — R109 Unspecified abdominal pain: Secondary | ICD-10-CM | POA: Insufficient documentation

## 2012-04-17 MED ORDER — SINCALIDE 5 MCG IJ SOLR
0.0200 ug/kg | Freq: Once | INTRAMUSCULAR | Status: AC
  Administered 2012-04-17: 1.6 ug via INTRAVENOUS

## 2012-04-17 MED ORDER — TECHNETIUM TC 99M MEBROFENIN IV KIT
5.0000 | PACK | Freq: Once | INTRAVENOUS | Status: AC | PRN
  Administered 2012-04-17: 5 via INTRAVENOUS

## 2012-04-22 ENCOUNTER — Telehealth (INDEPENDENT_AMBULATORY_CARE_PROVIDER_SITE_OTHER): Payer: Self-pay | Admitting: General Surgery

## 2012-04-22 ENCOUNTER — Telehealth (INDEPENDENT_AMBULATORY_CARE_PROVIDER_SITE_OTHER): Payer: Self-pay

## 2012-04-22 NOTE — Telephone Encounter (Signed)
Pt made aware of hida scan results.

## 2012-04-22 NOTE — Telephone Encounter (Signed)
Called and left message for patient to return call. Per Dr. Derrell Lolling, patient to be advised: "her HIDA (gallbladder) scan looks normal, and that her gallbladder appears to function normally. Will discuss further in office".  Asked patient to return call to obtain information noted above. Patient scheduled to see Dr. Derrell Lolling on 05/07/12 at 10:45.

## 2012-05-07 ENCOUNTER — Encounter (INDEPENDENT_AMBULATORY_CARE_PROVIDER_SITE_OTHER): Payer: Self-pay | Admitting: General Surgery

## 2012-05-07 ENCOUNTER — Ambulatory Visit (INDEPENDENT_AMBULATORY_CARE_PROVIDER_SITE_OTHER): Payer: Medicare Other | Admitting: General Surgery

## 2012-05-07 VITALS — BP 178/100 | HR 45 | Temp 97.8°F | Resp 18 | Ht 62.0 in | Wt 175.0 lb

## 2012-05-07 DIAGNOSIS — R109 Unspecified abdominal pain: Secondary | ICD-10-CM

## 2012-05-07 NOTE — Patient Instructions (Signed)
Your CT scan shows no evidence of abdominal wall hernia, no evidence of intra-abdominal mass. No evidence of cancer. You had a tiny stone in your left kidney which is unlikely to be causing symptoms. The radiologist questioned whether you might have tiny bilateral inguinal hernias.  Your physical exam today does not show any evidence of inguinal hernia or abdominal wall hernia.  Your gallbladder x-ray shows the gallbladder to be functioning normally, and I do not believe that the gallbladder is causing your pain.  There is no indication for surgery that I can see. I recommend that you have your primary care physician refer you to a gastroenterologist for further evaluation.  Return to see Dr. Derrell Lolling if further surgical problems arise.

## 2012-05-07 NOTE — Progress Notes (Signed)
Patient ID: Grace [name], female   DOB: [birth year], 71 y.o.   MRN: [id number]  No chief complaint on file.   HPI Grace [name] is a 71 y.o. female.  She returns to see me to discuss her abdominal pain and x-ray studies.  Her CT scan shows no evidence of abdominal wall hernia, and no evidence of abdominal mass or cancer. There is a tiny 8 mm non-obstructing stone in the left kidney. Radiologist questioned whether she might have tiny bilateral inguinal hernias. Hepatobiliary scan shows that the gallbladder fills and empties normally. Ejection fraction with CCK is normal. There were no symptoms with CCK injection.  I told her today that I did not think that she needed an operation and that she does not have a hernia. She does have gallstones but does not have an indication for a gallbladder operation.  Clinically she is stable. Still questions pain in the central abdomen. Worries that she might have an ulcer. There was a family member that had an ulcer. No other symptoms. HPI  Past Medical History  Diagnosis Date  . Arthritis   . GERD (gastroesophageal reflux disease)   . Hypertension   . Abdominal pain   . Constipation     occasional  . Ventral hernia   . Abdominal tenderness, unspecified site   . Cancer   . Cholecystitis, unspecified   . Dysphagia, unspecified   . Neoplasm of uncertain behavior of skin     Past Surgical History  Procedure Date  . Abdominal hysterectomy 1990's    partial  . Hernia repair 12/2002  . Oophorectomy 07/2003    Family History  Problem Relation Age of Onset  . Heart attack Mother   . Heart attack Father     Social History History  Substance Use Topics  . Smoking status: Never Smoker   . Smokeless tobacco: Never Used  . Alcohol Use: No    Allergies  Allergen Reactions  . Zocor (Simvastatin) Nausea And Vomiting and Other (See Comments)    Muscle weakness, extremely sick.    Current Outpatient Prescriptions  Medication Sig Dispense  Refill  . Calcium Carbonate-Vitamin D (CALCIUM + D PO) Take 1,500 mg by mouth.      . Coenzyme Q10 (CO Q-10) 100 MG CAPS Take by mouth daily.      Marland Kitchen doxycycline (VIBRA-TABS) 100 MG tablet Daily.      Marland Kitchen levothyroxine (SYNTHROID, LEVOTHROID) 75 MCG tablet Daily.      Marland Kitchen lisinopril-hydrochlorothiazide (PRINZIDE,ZESTORETIC) 20-25 MG per tablet Daily.      . LUTEIN-BILBERRY PO Take by mouth daily.      . meloxicam (MOBIC) 15 MG tablet Daily.      . Misc Natural Products (OSTEO BI-FLEX TRIPLE STRENGTH PO) Take by mouth 2 (two) times daily.      . Multiple Vitamin (MULTIVITAMIN) tablet Take 1 tablet by mouth daily. One-a-day      . nebivolol (BYSTOLIC) 10 MG tablet Take 10 mg by mouth daily.      . Omega-3 Fatty Acids (OMEGA 3 PO) Take by mouth daily. Vitamin + D        Review of Systems Review of Systems  Constitutional: Negative for fever, chills and unexpected weight change.  HENT: Negative for hearing loss, congestion, sore throat, trouble swallowing and voice change.   Eyes: Negative for visual disturbance.  Respiratory: Negative for cough and wheezing.   Cardiovascular: Negative for chest pain, palpitations and leg swelling.  Gastrointestinal: Positive for abdominal pain.  Negative for nausea, vomiting, diarrhea, constipation, blood in stool, abdominal distention and anal bleeding.  Genitourinary: Negative for hematuria, vaginal bleeding and difficulty urinating.  Musculoskeletal: Negative for arthralgias.  Skin: Negative for rash and wound.  Neurological: Negative for seizures, syncope and headaches.  Hematological: Negative for adenopathy. Does not bruise/bleed easily.  Psychiatric/Behavioral: Negative for confusion.    Blood pressure 178/100, pulse 45, temperature 97.8 F (36.6 C), temperature source Temporal, resp. rate 18, height 5\' 2"  (1.575 m), weight 175 lb (79.379 kg).  Physical Exam Physical Exam  Constitutional: She is oriented to person, place, and time. She appears  well-developed and well-nourished. No distress.  HENT:  Head: Normocephalic and atraumatic.  Eyes: Conjunctivae normal and EOM are normal. Pupils are equal, round, and reactive to light.  Neck: Neck supple.  Abdominal: Soft. Bowel sounds are normal. She exhibits no distension and no mass. There is no tenderness. There is no rebound and no guarding.  Genitourinary:       I examined her supine and standing. There is no evidence of inguinal mass or inguinal hernia. There is no abdominal wall hernia. She really is not tender.  Musculoskeletal: She exhibits no edema and no tenderness.  Neurological: She is alert and oriented to person, place, and time. She exhibits normal muscle tone. Coordination normal.  Skin: Skin is warm. No rash noted. She is not diaphoretic. No erythema. No pallor.  Psychiatric: She has a normal mood and affect. Her behavior is normal. Judgment and thought content normal.    Data Reviewed CT scan and HIDA scan  Assessment    Abdominal pain of uncertain etiology. This may simply be scar tissue around her previous surgeries.  No radiographic evidence of recurrent cancer.  Small nonobstructing left kidney stone. Doubt this is the source of her pain.  Gallstones. Clinical history and CCK stimulated hepatobiliary scan suggests that her gallstones are asymptomatic. There is no indication for cholecystectomy  Diastases recti. I have explained this anatomic variant to her and explained that surgery is not required  There is no evidence of abdominal wall or inguinal hernia.    Plan    I recommended that she have her primary care physician refer her to a gastroenterologist for further evaluation of her abdominal pain.       Angelia Mould. Derrell Lolling, M.D., [facility] General and Minimally invasive Surgery Breast and Colorectal Surgery Office:   [phone] Pager:   [phone]  05/07/2012, 10:53 AM

## 2012-05-13 ENCOUNTER — Encounter (INDEPENDENT_AMBULATORY_CARE_PROVIDER_SITE_OTHER): Payer: Medicare Other | Admitting: General Surgery

## 2012-10-07 ENCOUNTER — Other Ambulatory Visit: Payer: Self-pay

## 2012-10-07 DIAGNOSIS — Z1231 Encounter for screening mammogram for malignant neoplasm of breast: Secondary | ICD-10-CM

## 2012-11-06 ENCOUNTER — Ambulatory Visit
Admission: RE | Admit: 2012-11-06 | Discharge: 2012-11-06 | Disposition: A | Discharge status: Home / Self Care | Payer: Medicare Other | Source: Ambulatory Visit

## 2012-11-06 DIAGNOSIS — Z1231 Encounter for screening mammogram for malignant neoplasm of breast: Secondary | ICD-10-CM

## 2013-10-06 ENCOUNTER — Other Ambulatory Visit: Payer: Self-pay

## 2013-10-06 DIAGNOSIS — Z1231 Encounter for screening mammogram for malignant neoplasm of breast: Secondary | ICD-10-CM

## 2013-11-08 ENCOUNTER — Ambulatory Visit
Admission: RE | Admit: 2013-11-08 | Discharge: 2013-11-08 | Disposition: A | Discharge status: Home / Self Care | Payer: Medicare Other | Source: Ambulatory Visit

## 2013-11-08 DIAGNOSIS — Z1231 Encounter for screening mammogram for malignant neoplasm of breast: Secondary | ICD-10-CM

## 2014-10-10 ENCOUNTER — Other Ambulatory Visit: Payer: Self-pay

## 2014-10-10 DIAGNOSIS — Z1231 Encounter for screening mammogram for malignant neoplasm of breast: Secondary | ICD-10-CM

## 2014-11-11 ENCOUNTER — Ambulatory Visit
Admission: RE | Admit: 2014-11-11 | Discharge: 2014-11-11 | Disposition: A | Discharge status: Home / Self Care | Payer: Medicare HMO | Source: Ambulatory Visit

## 2014-11-11 DIAGNOSIS — Z1231 Encounter for screening mammogram for malignant neoplasm of breast: Secondary | ICD-10-CM

## 2014-12-03 ENCOUNTER — Emergency Department (INDEPENDENT_AMBULATORY_CARE_PROVIDER_SITE_OTHER): Payer: Medicare HMO

## 2014-12-03 ENCOUNTER — Encounter (HOSPITAL_COMMUNITY): Payer: Self-pay | Admitting: Emergency Medicine

## 2014-12-03 ENCOUNTER — Emergency Department (INDEPENDENT_AMBULATORY_CARE_PROVIDER_SITE_OTHER)
Admission: EM | Admit: 2014-12-03 | Discharge: 2014-12-03 | Disposition: A | Discharge status: Home / Self Care | Payer: Medicare HMO | Source: Home / Self Care | Attending: Family Medicine | Admitting: Family Medicine

## 2014-12-03 DIAGNOSIS — S60222A Contusion of left hand, initial encounter: Secondary | ICD-10-CM

## 2014-12-03 DIAGNOSIS — S9002XA Contusion of left ankle, initial encounter: Secondary | ICD-10-CM
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
## 2015-10-13 ENCOUNTER — Other Ambulatory Visit: Payer: Self-pay

## 2015-10-13 DIAGNOSIS — Z1231 Encounter for screening mammogram for malignant neoplasm of breast: Secondary | ICD-10-CM

## 2015-11-13 ENCOUNTER — Ambulatory Visit
Admission: RE | Admit: 2015-11-13 | Discharge: 2015-11-13 | Disposition: A | Discharge status: Home / Self Care | Payer: Medicare HMO | Source: Ambulatory Visit

## 2015-11-13 DIAGNOSIS — Z1231 Encounter for screening mammogram for malignant neoplasm of breast: Secondary | ICD-10-CM

## 2016-06-20 ENCOUNTER — Other Ambulatory Visit: Payer: Self-pay | Admitting: Gastroenterology

## 2016-06-21 ENCOUNTER — Ambulatory Visit (INDEPENDENT_AMBULATORY_CARE_PROVIDER_SITE_OTHER): Payer: Medicare HMO

## 2016-06-21 ENCOUNTER — Ambulatory Visit (INDEPENDENT_AMBULATORY_CARE_PROVIDER_SITE_OTHER): Payer: Medicare HMO | Admitting: Orthopedic Surgery

## 2016-06-21 VITALS — Ht 62.0 in | Wt 175.0 lb

## 2016-06-21 DIAGNOSIS — M170 Bilateral primary osteoarthritis of knee: Secondary | ICD-10-CM | POA: Diagnosis not present

## 2016-06-21 DIAGNOSIS — M1711 Unilateral primary osteoarthritis, right knee: Secondary | ICD-10-CM

## 2016-06-21 DIAGNOSIS — M25561 Pain in right knee: Secondary | ICD-10-CM

## 2016-06-21 MED ORDER — METHYLPREDNISOLONE ACETATE 40 MG/ML IJ SUSP
40.0000 mg | INTRAMUSCULAR | Status: AC | PRN
  Administered 2016-06-21: 40 mg via INTRA_ARTICULAR

## 2016-06-21 MED ORDER — LIDOCAINE HCL 1 % IJ SOLN
5.0000 mL | INTRAMUSCULAR | Status: AC | PRN
  Administered 2016-06-21: 5 mL

## 2016-06-21 NOTE — Progress Notes (Signed)
Office Visit Note   Patient: Grace [name]           Date of Birth: 04-16-1941           MRN: [id number] Visit Date: 06/21/2016              Requested by: Cleone Slim. Rozetta Nunnery, MD [id number] [place], [place] [address] PCP: Adline Mango, MD   Assessment & Plan: Visit Diagnoses:  1. Bilateral primary osteoarthritis of knee   2. Acute pain of right knee     Plan: Right knee was injected with steroid. Patient has had multiple hyaluronic acid injections in the past and these no longer work. Patient is using a cane for ambulation discussed that we could proceed with additional steroid injections but if she fails conservative treatment and her best option would be a total knee replacement. Recommended continuing her exercise.  Follow-Up Instructions: Return if symptoms worsen or fail to improve.   Orders:  Orders Placed This Encounter  Procedures  . Large Joint Injection/Arthrocentesis  . XR Knee 1-2 Views Right   No orders of the defined types were placed in this encounter.     Procedures: Large Joint Inj Date/Time: 06/21/2016 11:02 AM Performed by: Tionne Dayhoff V Authorized by: Newt Minion   Consent Given by:  Patient Site marked: the procedure site was marked   Timeout: prior to procedure the correct patient, procedure, and site was verified   Indications:  Pain and diagnostic evaluation Location:  Knee Site:  R knee Prep: patient was prepped and draped in usual sterile fashion   Needle Size:  22 G Needle Length:  1.5 inches Ultrasound Guidance: No   Fluoroscopic Guidance: No   Arthrogram: No   Medications:  5 mL lidocaine 1 %; 40 mg methylPREDNISolone acetate 40 MG/ML Aspiration Attempted: No   Patient tolerance:  Patient tolerated the procedure well with no immediate complications     Clinical Data: No additional findings.   Subjective: Chief Complaint  Patient presents with  . Right Knee - Pain    Right knee pain for over a week. She states  that she is having swelling and painful weight bearing. Ambulates with cane. She is not taking anything for pain. She has not had an injury    Review of Systems   Objective: Vital Signs: Ht 5\' 2"  (1.575 m)   Wt 175 lb (79.4 kg)   BMI 32.01 kg/m   Physical Exam examination patient is alert oriented no adenopathy well-dressed normal affect normal respiratory effort she does have an antalgic gait uses a cane. She has decreased range of motion both knees with varus alignment with both knees. She is globally tender to palpation around her knees bilaterally. There is no redness no cellulitis there is a mild effusion no signs of infection. Collaterals and cruciates are stable. Radiographs show tricompartmental arthritic changes.  Ortho Exam  Specialty Comments:  No specialty comments available.  Imaging: Xr Knee 1-2 Views Right  Result Date: 06/21/2016 2 view radiographs of the right knee shows severe tricompartmental osteoarthritis of the right knee and left knee. There is no signs of acute fractures. She has periarticular bony spurs in all 3 compartments. Bone-on-bone contact medial joint line both knees does appear to have some loose bodies in the left knee but no mechanical symptoms in her right knee.    PMFS History: Patient Active Problem List   Diagnosis Date Noted  . Bilateral primary osteoarthritis of knee 06/21/2016  .  Abdominal pain of unknown etiology 05/07/2012   Past Medical History:  Diagnosis Date  . Abdominal pain   . Abdominal tenderness, unspecified site   . Arthritis   . Cancer   . Cholecystitis, unspecified   . Constipation    occasional  . Dysphagia, unspecified(787.20)   . GERD (gastroesophageal reflux disease)   . Hypertension   . Neoplasm of uncertain behavior of skin   . Ventral hernia     Family History  Problem Relation Age of Onset  . Heart attack Mother   . Heart attack Father     Past Surgical History:  Procedure Laterality Date  .  ABDOMINAL HYSTERECTOMY  1990's   partial  . HERNIA REPAIR  12/2002  . OOPHORECTOMY  07/2003   Social History   Occupational History  . Not on file.   Social History Main Topics  . Smoking status: Never Smoker  . Smokeless tobacco: Never Used  . Alcohol use No  . Drug use: No  . Sexual activity: Not on file

## 2016-07-12 ENCOUNTER — Encounter (HOSPITAL_COMMUNITY): Payer: Self-pay

## 2016-07-12 ENCOUNTER — Other Ambulatory Visit: Payer: Self-pay | Admitting: Gastroenterology

## 2016-07-23 ENCOUNTER — Ambulatory Visit (HOSPITAL_COMMUNITY): Admit: 2016-07-23 | Payer: Medicare HMO | Admitting: Gastroenterology

## 2016-07-23 ENCOUNTER — Ambulatory Visit (HOSPITAL_COMMUNITY): Admission: RE | Admit: 2016-07-23 | Payer: Medicare HMO | Source: Ambulatory Visit | Admitting: Gastroenterology

## 2016-07-23 ENCOUNTER — Encounter (HOSPITAL_COMMUNITY): Payer: Self-pay

## 2016-07-23 ENCOUNTER — Encounter (HOSPITAL_COMMUNITY): Admission: RE | Payer: Self-pay | Source: Ambulatory Visit

## 2016-07-23 SURGERY — COLONOSCOPY WITH PROPOFOL
Anesthesia: Monitor Anesthesia Care

## 2016-10-07 ENCOUNTER — Other Ambulatory Visit: Payer: Self-pay | Admitting: Internal Medicine

## 2016-10-07 DIAGNOSIS — Z1231 Encounter for screening mammogram for malignant neoplasm of breast: Secondary | ICD-10-CM

## 2016-10-11 ENCOUNTER — Other Ambulatory Visit: Payer: Self-pay | Admitting: Gastroenterology

## 2016-11-13 ENCOUNTER — Ambulatory Visit
Admission: RE | Admit: 2016-11-13 | Discharge: 2016-11-13 | Disposition: A | Discharge status: Home / Self Care | Payer: Medicare HMO | Source: Ambulatory Visit | Attending: Internal Medicine | Admitting: Internal Medicine

## 2016-11-13 DIAGNOSIS — Z1231 Encounter for screening mammogram for malignant neoplasm of breast: Secondary | ICD-10-CM

## 2017-01-06 ENCOUNTER — Encounter (HOSPITAL_COMMUNITY): Payer: Self-pay

## 2017-01-06 ENCOUNTER — Ambulatory Visit (HOSPITAL_COMMUNITY): Payer: Medicare HMO | Admitting: Registered Nurse

## 2017-01-06 ENCOUNTER — Ambulatory Visit (HOSPITAL_COMMUNITY)
Admission: RE | Admit: 2017-01-06 | Discharge: 2017-01-06 | Disposition: A | Discharge status: Home / Self Care | Payer: Medicare HMO | Source: Ambulatory Visit | Attending: Gastroenterology | Admitting: Gastroenterology

## 2017-01-06 ENCOUNTER — Encounter (HOSPITAL_COMMUNITY)
Admission: RE | Disposition: A | Discharge status: Home / Self Care | Payer: Self-pay | Source: Ambulatory Visit | Attending: Gastroenterology

## 2017-01-06 DIAGNOSIS — Z79899 Other long term (current) drug therapy: Secondary | ICD-10-CM | POA: Diagnosis not present

## 2017-01-06 DIAGNOSIS — Z7982 Long term (current) use of aspirin: Secondary | ICD-10-CM | POA: Insufficient documentation

## 2017-01-06 DIAGNOSIS — I10 Essential (primary) hypertension: Secondary | ICD-10-CM | POA: Insufficient documentation

## 2017-01-06 DIAGNOSIS — K635 Polyp of colon: Secondary | ICD-10-CM | POA: Insufficient documentation

## 2017-01-06 DIAGNOSIS — K222 Esophageal obstruction: Secondary | ICD-10-CM | POA: Diagnosis not present

## 2017-01-06 DIAGNOSIS — D123 Benign neoplasm of transverse colon: Secondary | ICD-10-CM | POA: Insufficient documentation

## 2017-01-06 DIAGNOSIS — K219 Gastro-esophageal reflux disease without esophagitis: Secondary | ICD-10-CM | POA: Diagnosis present

## 2017-01-06 DIAGNOSIS — E039 Hypothyroidism, unspecified: Secondary | ICD-10-CM | POA: Insufficient documentation

## 2017-01-06 DIAGNOSIS — Z1211 Encounter for screening for malignant neoplasm of colon: Secondary | ICD-10-CM | POA: Diagnosis not present

## 2017-01-06 DIAGNOSIS — Z791 Long term (current) use of non-steroidal anti-inflammatories (NSAID): Secondary | ICD-10-CM | POA: Diagnosis not present

## 2017-01-06 HISTORY — PX: ESOPHAGOGASTRODUODENOSCOPY (EGD) WITH PROPOFOL: SHX5813

## 2017-01-06 HISTORY — PX: BALLOON DILATION: SHX5330

## 2017-01-06 HISTORY — PX: COLONOSCOPY WITH PROPOFOL: SHX5780

## 2017-01-06 SURGERY — COLONOSCOPY WITH PROPOFOL
Anesthesia: Monitor Anesthesia Care

## 2017-01-06 MED ORDER — PROPOFOL 10 MG/ML IV BOLUS
INTRAVENOUS | Status: AC
  Filled 2017-01-06: qty 60

## 2017-01-06 MED ORDER — ONDANSETRON HCL 4 MG/2ML IJ SOLN
INTRAMUSCULAR | Status: AC
  Filled 2017-01-06: qty 2

## 2017-01-06 MED ORDER — SODIUM CHLORIDE 0.9 % IV SOLN: INTRAVENOUS | Status: DC

## 2017-01-06 MED ORDER — LIDOCAINE 2% (20 MG/ML) 5 ML SYRINGE
INTRAMUSCULAR | Status: DC | PRN
  Administered 2017-01-06: 40 mg via INTRAVENOUS

## 2017-01-06 MED ORDER — PROPOFOL 10 MG/ML IV BOLUS
INTRAVENOUS | Status: DC | PRN
  Administered 2017-01-06: 20 mg via INTRAVENOUS
  Administered 2017-01-06: 30 mg via INTRAVENOUS

## 2017-01-06 MED ORDER — ONDANSETRON HCL 4 MG/2ML IJ SOLN
INTRAMUSCULAR | Status: DC | PRN
  Administered 2017-01-06: 4 mg via INTRAVENOUS

## 2017-01-06 MED ORDER — PROPOFOL 500 MG/50ML IV EMUL
INTRAVENOUS | Status: DC | PRN
  Administered 2017-01-06: 100 ug/kg/min via INTRAVENOUS

## 2017-01-06 MED ORDER — LACTATED RINGERS IV SOLN
INTRAVENOUS | Status: DC
  Administered 2017-01-06: 1000 mL via INTRAVENOUS

## 2017-01-06 SURGICAL SUPPLY — 24 items

## 2017-01-06 NOTE — Anesthesia Preprocedure Evaluation (Signed)
Anesthesia Evaluation  Patient identified by MRN, date of birth, ID band Patient awake    Reviewed: Allergy & Precautions, NPO status , Patient's Chart, lab work & pertinent test results, reviewed documented beta blocker date and time   Airway Mallampati: III  TM Distance: <3 FB Neck ROM: Full    Dental  (+) Teeth Intact   Pulmonary neg pulmonary ROS,    Pulmonary exam normal        Cardiovascular hypertension, Pt. on home beta blockers and Pt. on medications Normal cardiovascular exam     Neuro/Psych negative neurological ROS  negative psych ROS   GI/Hepatic Neg liver ROS, GERD  Medicated,  Endo/Other  Hypothyroidism   Renal/GU negative Renal ROS  negative genitourinary   Musculoskeletal  (+) Arthritis , Osteoarthritis,    Abdominal   Peds negative pediatric ROS (+)  Hematology negative hematology ROS (+)   Anesthesia Other Findings   Reproductive/Obstetrics negative OB ROS                             Anesthesia Physical Anesthesia Plan  ASA: II  Anesthesia Plan: MAC   Post-op Pain Management:    Induction: Intravenous  PONV Risk Score and Plan: 0 and Propofol  Airway Management Planned:   Additional Equipment:   Intra-op Plan:   Post-operative Plan:   Informed Consent: I have reviewed the patients History and Physical, chart, labs and discussed the procedure including the risks, benefits and alternatives for the proposed anesthesia with the patient or authorized representative who has indicated his/her understanding and acceptance.     Plan Discussed with: CRNA  Anesthesia Plan Comments:         Anesthesia Quick Evaluation

## 2017-01-06 NOTE — Op Note (Signed)
[facility] Patient Name: Grace [name] Procedure Date: 01/06/2017 MRN: [id number] Attending MD: Garlan Fair , MD Date of Birth: 05/31/1941 CSN: [id number] Age: 76 Admit Type: Outpatient Procedure:                Upper GI endoscopy Indications:              Dysphagia Providers:                Garlan Fair, MD, Kingsley Plan, RN, Lillie Fragmin, RN, Tinnie Gens, Technician Referring MD:              Medicines:                Propofol per Anesthesia Complications:            No immediate complications. Estimated Blood Loss:     Estimated blood loss: none. Procedure:                Pre-Anesthesia Assessment:                           - Prior to the procedure, a History and Physical                            was performed, and patient medications and                            allergies were reviewed. The patient's tolerance of                            previous anesthesia was also reviewed. The risks                            and benefits of the procedure and the sedation                            options and risks were discussed with the patient.                            All questions were answered, and informed consent                            was obtained. Prior Anticoagulants: The patient has                            taken no previous anticoagulant or antiplatelet                            agents. ASA Grade Assessment: II - A patient with                            mild systemic disease. After reviewing the risks  and benefits, the patient was deemed in                            satisfactory condition to undergo the procedure.                           After obtaining informed consent, the endoscope was                            passed under direct vision. Throughout the                            procedure, the patient's blood pressure, pulse, and                            oxygen  saturations were monitored continuously. The                            Endoscope was introduced through the mouth, and                            advanced to the second part of duodenum. The upper                            GI endoscopy was accomplished without difficulty.                            The patient tolerated the procedure well. Scope In: Scope Out: Findings:      The Z-line was regular and was found 38 cm from the incisors.      One shallow benign-appearing, intrinsic stenosis was found 38 cm from       the incisors. This measured less than one cm (in length) and was       traversed. A TTS dilator was passed through the scope. Dilation with a       15-16.5-18 mm balloon dilator was performed to 18 mm. There was no       mucosal disruption of the shallow bening stricture post dilation. No       Barrett esophagus was present.      The entire examined stomach was normal.      The examined duodenum was normal. Impression:               - Z-line regular, 38 cm from the incisors.                           - Benign-appearing esophageal stenosis. Dilated.                           - Normal stomach.                           - Normal examined duodenum.                           - No specimens collected. Moderate Sedation:      N/A- Per  Anesthesia Care Recommendation:           - Patient has a contact number available for                            emergencies. The signs and symptoms of potential                            delayed complications were discussed with the                            patient. Return to normal activities tomorrow.                            Written discharge instructions were provided to the                            patient.                           - Return to primary care physician PRN.                           - Resume previous diet.                           - Continue present medications. Procedure Code(s):        --- Professional ---                            [phone], Esophagogastroduodenoscopy, flexible,                            transoral; with transendoscopic balloon dilation of                            esophagus (less than 30 mm diameter) Diagnosis Code(s):        --- Professional ---                           K22.2, Esophageal obstruction                           R13.10, Dysphagia, unspecified CPT copyright 2016 American Medical Association. All rights reserved. The codes documented in this report are preliminary and upon coder review may  be revised to meet current compliance requirements. Earle Gell, MD Garlan Fair, MD 01/06/2017 9:07:35 AM This report has been signed electronically. Number of Addenda: 0

## 2017-01-06 NOTE — H&P (Signed)
Procedure: Diagnostic esophagogastroduodenoscopy to evaluate gastroesophageal reflux associated with esophageal dysphagia followed by repeat screening colonoscopy. 07/10/2006 normal screening colonoscopy was performed.  History: The patient is a 76 year old female born 1941/06/05. She has chronic gastroesophageal reflux treated with proton pump inhibitor therapy. She has intermittent esophageal dysphagia. She underwent a normal screening colonoscopy on 07/10/2006.  She is scheduled to undergo diagnostic esophagogastroduodenoscopy with possible esophageal stricture dilation followed by repeat screening colonoscopy.  Past medical history: Hysterectomy. Inguinal hernia repair surgery. Hypertension. History of ovarian cancer. Hypothyroidism. Gastroesophageal reflux associated with esophageal dysphagia. Pulmonary nodules. Gallstones.  Exam: Patient is alert and lying comfortably on the endoscopy stretcher. Abdomen is soft and nontender to palpation. Lungs are clear to auscultation. Cardiac exam reveals a regular rhythm.  Plan: Proceed with diagnostic esophagogastroduodenoscopy with possible esophageal stricture dilation followed by screening colonoscopy.

## 2017-01-06 NOTE — Discharge Instructions (Signed)
Esophagogastroduodenoscopy, Care After °Refer to this sheet in the next few weeks. These instructions provide you with information about caring for yourself after your procedure. Your health care provider may also give you more specific instructions. Your treatment has been planned according to current medical practices, but problems sometimes occur. Call your health care provider if you have any problems or questions after your procedure. °What can I expect after the procedure? °After the procedure, it is common to have: °· A sore throat. °· Nausea. °· Bloating. °· Dizziness. °· Fatigue. ° °Follow these instructions at home: °· Do not eat or drink anything until the numbing medicine (local anesthetic) has worn off and your gag reflex has returned. You will know that the local anesthetic has worn off when you can swallow comfortably. °· Do not drive for 24 hours if you received a medicine to help you relax (sedative). °· If your health care provider took a tissue sample for testing during the procedure, make sure to get your test results. This is your responsibility. Ask your health care provider or the department performing the test when your results will be ready. °· Keep all follow-up visits as told by your health care provider. This is important. °Contact a health care provider if: °· You cannot stop coughing. °· You are not urinating. °· You are urinating less than usual. °Get help right away if: °· You have trouble swallowing. °· You cannot eat or drink. °· You have throat or chest pain that gets worse. °· You are dizzy or light-headed. °· You faint. °· You have nausea or vomiting. °· You have chills. °· You have a fever. °· You have severe abdominal pain. °· You have black, tarry, or bloody stools. °This information is not intended to replace advice given to you by your health care provider. Make sure you discuss any questions you have with your health care provider. °Document Released: 06/24/2012 Document  Revised: 12/14/2015 Document Reviewed: 06/01/2015 °Elsevier Interactive Patient Education © 2018 [employer] °Colonoscopy, Adult, Care After °This sheet gives you information about how to care for yourself after your procedure. Your doctor may also give you more specific instructions. If you have problems or questions, call your doctor. °Follow these instructions at home: °General instructions ° °· For the first 24 hours after the procedure: °? Do not drive or use machinery. °? Do not sign important documents. °? Do not drink alcohol. °? Do your daily activities more slowly than normal. °? Eat foods that are soft and easy to digest. °? Rest often. °· Take over-the-counter or prescription medicines only as told by your doctor. °· It is up to you to get the results of your procedure. Ask your doctor, or the department performing the procedure, when your results will be ready. °To help cramping and bloating: °· Try walking around. °· Put heat on your belly (abdomen) as told by your doctor. Use a heat source that your doctor recommends, such as a moist heat pack or a heating pad. °? Put a towel between your skin and the heat source. °? Leave the heat on for 20-30 minutes. °? Remove the heat if your skin turns bright red. This is especially important if you cannot feel pain, heat, or cold. You can get burned. °Eating and drinking °· Drink enough fluid to keep your pee (urine) clear or pale yellow. °· Return to your normal diet as told by your doctor. Avoid heavy or fried foods that are hard to digest. °· Avoid   drinking alcohol for as long as told by your doctor. °Contact a doctor if: °· You have blood in your poop (stool) 2-3 days after the procedure. °Get help right away if: °· You have more than a small amount of blood in your poop. °· You see large clumps of tissue (blood clots) in your poop. °· Your belly is swollen. °· You feel sick to your stomach (nauseous). °· You throw up (vomit). °· You have a fever. °· You  have belly pain that gets worse, and medicine does not help your pain. °This information is not intended to replace advice given to you by your health care provider. Make sure you discuss any questions you have with your health care provider. °Document Released: 08/10/2010 Document Revised: 04/01/2016 Document Reviewed: 04/01/2016 °Elsevier Interactive Patient Education © 2017 [employer] ° °

## 2017-01-06 NOTE — Op Note (Signed)
[facility] Patient Name: Grace [name] Procedure Date: 01/06/2017 MRN: [id number] Attending MD: Garlan Fair , MD Date of Birth: 03/22/1941 CSN: [id number] Age: 76 Admit Type: Outpatient Procedure:                Colonoscopy Indications:              Screening for colorectal malignant neoplasm.                            07/10/2006 normal screening colonoscopy was                            performed. Providers:                Garlan Fair, MD, Kingsley Plan, RN, Lillie Fragmin, RN, Tinnie Gens, Technician Referring MD:              Medicines:                Propofol per Anesthesia Complications:            No immediate complications. Estimated Blood Loss:     Estimated blood loss was minimal. Procedure:                Pre-Anesthesia Assessment:                           - Prior to the procedure, a History and Physical                            was performed, and patient medications and                            allergies were reviewed. The patient's tolerance of                            previous anesthesia was also reviewed. The risks                            and benefits of the procedure and the sedation                            options and risks were discussed with the patient.                            All questions were answered, and informed consent                            was obtained. Prior Anticoagulants: The patient has                            taken no previous anticoagulant or antiplatelet                            agents. ASA Grade Assessment:  II - A patient with                            mild systemic disease. After reviewing the risks                            and benefits, the patient was deemed in                            satisfactory condition to undergo the procedure.                           - Prior to the procedure, a History and Physical                            was performed, and  patient medications and                            allergies were reviewed. The patient's tolerance of                            previous anesthesia was also reviewed. The risks                            and benefits of the procedure and the sedation                            options and risks were discussed with the patient.                            All questions were answered, and informed consent                            was obtained. Prior Anticoagulants: The patient has                            taken no previous anticoagulant or antiplatelet                            agents. ASA Grade Assessment: II - A patient with                            mild systemic disease. After reviewing the risks                            and benefits, the patient was deemed in                            satisfactory condition to undergo the procedure.                           After obtaining informed consent, the colonoscope  was passed under direct vision. Throughout the                            procedure, the patient's blood pressure, pulse, and                            oxygen saturations were monitored continuously. The                            EC-[id number] ([id number]) scope was introduced through                            the anus and advanced to the the cecum, identified                            by appendiceal orifice and ileocecal valve. The                            colonoscopy was performed without difficulty. The                            patient tolerated the procedure well. The quality                            of the bowel preparation was good. The appendiceal                            orifice and the rectum were photographed. Scope In: 8:35:57 AM Scope Out: 8:57:33 AM Scope Withdrawal Time: 0 hours 15 minutes 21 seconds  Total Procedure Duration: 0 hours 21 minutes 36 seconds  Findings:      The perianal and digital rectal examinations were  normal.      A 6 mm polyp was found in the proximal ascending colon. The polyp was       sessile. The polyp was removed with a piecemeal technique using a cold       snare. Resection and retrieval were complete.      A 3 mm polyp was found in the mid transverse colon. The polyp was       sessile. The polyp was removed with a cold biopsy forceps. Resection and       retrieval were complete.      The exam was otherwise without abnormality. Impression:               - One 6 mm polyp in the proximal ascending colon,                            removed piecemeal using a cold snare. Resected and                            retrieved.                           - One 3 mm polyp in the mid transverse colon,  removed with a cold biopsy forceps. Resected and                            retrieved.                           - The examination was otherwise normal. Moderate Sedation:      N/A- Per Anesthesia Care Recommendation:           - Patient has a contact number available for                            emergencies. The signs and symptoms of potential                            delayed complications were discussed with the                            patient. Return to normal activities tomorrow.                            Written discharge instructions were provided to the                            patient.                           - Repeat colonoscopy date to be determined after                            pending pathology results are reviewed for                            surveillance.                           - Resume previous diet.                           - Continue present medications. Procedure Code(s):        --- Professional ---                           [phone], Colonoscopy, flexible; with removal of                            tumor(s), polyp(s), or other lesion(s) by snare                            technique                           83662, 4,  Colonoscopy, flexible; with biopsy,                            single or multiple Diagnosis Code(s):        --- Professional ---  Z12.11, Encounter for screening for malignant                            neoplasm of colon                           D12.2, Benign neoplasm of ascending colon                           D12.3, Benign neoplasm of transverse colon (hepatic                            flexure or splenic flexure) CPT copyright 2016 American Medical Association. All rights reserved. The codes documented in this report are preliminary and upon coder review may  be revised to meet current compliance requirements. Earle Gell, MD Garlan Fair, MD 01/06/2017 9:11:43 AM This report has been signed electronically. Number of Addenda: 0

## 2017-01-06 NOTE — Transfer of Care (Signed)
Immediate Anesthesia Transfer of Care Note  Patient: Grace [name]  Procedure(s) Performed: Procedure(s): COLONOSCOPY WITH PROPOFOL (N/A) ESOPHAGOGASTRODUODENOSCOPY (EGD) WITH PROPOFOL (N/A) BALLOON DILATION (N/A)  Patient Location: PACU  Anesthesia Type:MAC  Level of Consciousness:  sedated, patient cooperative and responds to stimulation  Airway & Oxygen Therapy:Patient Spontanous Breathing and Patient connected to face mask oxgen  Post-op Assessment:  Report given to PACU RN and Post -op Vital signs reviewed and stable  Post vital signs:  Reviewed and stable  Last Vitals:  Vitals:   01/06/17 0624  BP: (!) 171/65  Pulse: 85  Resp: 12  Temp: 35.3 C    Complications: No apparent anesthesia complications

## 2017-01-06 NOTE — Anesthesia Postprocedure Evaluation (Signed)
Anesthesia Post Note  Patient: Grace [name]  Procedure(s) Performed: Procedure(s) (LRB): COLONOSCOPY WITH PROPOFOL (N/A) ESOPHAGOGASTRODUODENOSCOPY (EGD) WITH PROPOFOL (N/A) BALLOON DILATION (N/A)     Patient location during evaluation: PACU Anesthesia Type: MAC Level of consciousness: awake and alert Pain management: pain level controlled Vital Signs Assessment: post-procedure vital signs reviewed and stable Respiratory status: spontaneous breathing, nonlabored ventilation, respiratory function stable and patient connected to nasal cannula oxygen Cardiovascular status: stable and blood pressure returned to baseline Anesthetic complications: no    Last Vitals:  Vitals:   01/06/17 0926 01/06/17 0930  BP:  (!) 160/75  Pulse: 72 74  Resp: 13 10  Temp:      Last Pain:  Vitals:   01/06/17 0904  TempSrc: Oral                 Effie Berkshire

## 2017-01-08 ENCOUNTER — Encounter (HOSPITAL_COMMUNITY): Payer: Self-pay | Admitting: Gastroenterology
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
## 2017-09-01 ENCOUNTER — Encounter: Payer: Self-pay | Admitting: Internal Medicine

## 2017-10-27 ENCOUNTER — Other Ambulatory Visit: Payer: Self-pay | Admitting: Internal Medicine

## 2017-10-27 DIAGNOSIS — Z1231 Encounter for screening mammogram for malignant neoplasm of breast: Secondary | ICD-10-CM

## 2017-11-04 ENCOUNTER — Encounter: Payer: Self-pay | Admitting: *Deleted

## 2017-11-19 ENCOUNTER — Ambulatory Visit
Admission: RE | Admit: 2017-11-19 | Discharge: 2017-11-19 | Disposition: A | Discharge status: Home / Self Care | Payer: Medicare HMO | Source: Ambulatory Visit | Attending: Internal Medicine | Admitting: Internal Medicine

## 2017-11-19 DIAGNOSIS — Z1231 Encounter for screening mammogram for malignant neoplasm of breast: Secondary | ICD-10-CM

## 2017-11-25 ENCOUNTER — Encounter: Payer: Self-pay | Admitting: Internal Medicine

## 2017-11-25 ENCOUNTER — Ambulatory Visit: Payer: Medicare HMO | Admitting: Internal Medicine

## 2017-11-25 VITALS — BP 148/78 | HR 68 | Ht 62.0 in | Wt 177.8 lb

## 2017-11-25 DIAGNOSIS — K5900 Constipation, unspecified: Secondary | ICD-10-CM

## 2017-11-25 DIAGNOSIS — K623 Rectal prolapse: Secondary | ICD-10-CM | POA: Diagnosis not present

## 2017-11-25 DIAGNOSIS — K219 Gastro-esophageal reflux disease without esophagitis: Secondary | ICD-10-CM

## 2017-11-25 DIAGNOSIS — K648 Other hemorrhoids: Secondary | ICD-10-CM

## 2017-11-25 NOTE — Progress Notes (Signed)
Patient ID: Grace [name], female   DOB: [birth year], 77 y.o.   MRN: [id number] HPI: Grace [name] is a 77 year old female with a history of adenomatous colon polyps, history of hemorrhoids, remote ovarian cancer, GERD, hypertension who is seen to evaluate intermittent hemorrhoidal bleeding and heme positive stool.  She is here alone today.  She was previously cared for by Dr. Earle Gell before his retirement.  Dr. Wynetta Emery did an upper endoscopy and colonoscopy 11 months ago on 01/06/2017.  Upper endoscopy showed a benign-appearing esophageal stenosis which was dilated to 18 mm with balloon.  Esophagus was otherwise normal.  Stomach and duodenum were normal.  Colonoscopy revealed a 6 mm adenoma in the ascending colon and a 3 mm adenoma in the transverse colon.  Both were removed and without high-grade dysplasia.  The rest of the exam was normal.  The patient reports that she has been having issues with hard stool and difficulty with defecation.  She is intermittently seen bright red blood with wiping after a hard stool.  At times she reports feeling like there is a "blockage" which she feels is been gradually escalating.  She has been using Senokot S 2 to 3 days/week and with this her symptoms have resolved.  Bowel movements have been easier, not hard and much more easy to pass.  She tried MiraLAX which did not help.  No abdominal pain.  No issues with heartburn though she plans to switch from omeprazole to ranitidine twice daily because she feels this has less long-term risk.  Her dysphagia improved after dilation.  Past Medical History:  Diagnosis Date  . Abdominal pain   . Abdominal tenderness, unspecified site   . Arthritis   . Cancer (HCC)    Ovarian  . Cholecystitis, unspecified   . Constipation    occasional  . Dysphagia, unspecified(787.20)   . GERD (gastroesophageal reflux disease)   . Hypertension   . Neoplasm of uncertain behavior of skin   . Tubular adenoma of colon   . Ventral  hernia     Past Surgical History:  Procedure Laterality Date  . ABDOMINAL HYSTERECTOMY  1990's   partial  . BALLOON DILATION N/A 01/06/2017   Procedure: BALLOON DILATION;  Surgeon: Garlan Fair, MD;  Location: [facility];  Service: Endoscopy;  Laterality: N/A;  . COLONOSCOPY WITH PROPOFOL N/A 01/06/2017   Procedure: COLONOSCOPY WITH PROPOFOL;  Surgeon: Garlan Fair, MD;  Location: [facility];  Service: Endoscopy;  Laterality: N/A;  . ESOPHAGOGASTRODUODENOSCOPY (EGD) WITH PROPOFOL N/A 01/06/2017   Procedure: ESOPHAGOGASTRODUODENOSCOPY (EGD) WITH PROPOFOL;  Surgeon: Garlan Fair, MD;  Location: [facility];  Service: Endoscopy;  Laterality: N/A;  . HERNIA REPAIR  12/2002  . OOPHORECTOMY  07/2003    Outpatient Medications Prior to Visit  Medication Sig Dispense Refill  . aspirin EC 81 MG tablet Take 81 mg by mouth every other day.     . Calcium Carbonate-Vitamin D (CALCIUM + D PO) Take 1 tablet by mouth daily.     . Cholecalciferol (VITAMIN D-3) 1000 units CAPS Take 1 capsule by mouth 5 (five) times daily.    . Coenzyme Q10 (CO Q-10) 100 MG CAPS Take 100 mg by mouth 4 (four) times a week.     . hydrochlorothiazide (HYDRODIURIL) 25 MG tablet Take 25 mg by mouth daily. (0900)    . levothyroxine (SYNTHROID, LEVOTHROID) 75 MCG tablet Take 75 mcg by mouth daily. (0800)    . LUTEIN PO Take 1 tablet by mouth 4 (  four) times a week.    . magnesium gluconate (MAGONATE) 500 MG tablet Take 400 mg by mouth 2 (two) times daily.    . Multiple Vitamin (MULTIVITAMIN) tablet Take 1 tablet by mouth daily. One-a-day    . naproxen sodium (ANAPROX) 220 MG tablet Take 220 mg by mouth 2 (two) times daily as needed (for pain).    . nebivolol (BYSTOLIC) 10 MG tablet Take 1 tablet by mouth daily.    Marland Kitchen omeprazole (PRILOSEC) 20 MG capsule Take 20 mg by mouth daily. (0900)    . carvedilol (COREG) 12.5 MG tablet Take 12.5 mg by mouth 2 (two) times daily. (0900 & 2100)     No facility-administered  medications prior to visit.     Allergies  Allergen Reactions  . Zocor [Simvastatin] Nausea And Vomiting and Other (See Comments)    Muscle weakness, extremely sick.    Family History  Problem Relation Age of Onset  . Heart attack Mother   . Heart attack Father   . Breast cancer Neg Hx     Social History   Tobacco Use  . Smoking status: Never Smoker  . Smokeless tobacco: Never Used  Substance Use Topics  . Alcohol use: No  . Drug use: No    ROS: As per history of present illness, otherwise negative  BP (!) 148/78   Pulse 68   Ht 5\' 2"  (1.575 m)   Wt 177 lb 12.8 oz (80.6 kg)   BMI 32.52 kg/m  Constitutional: Well-developed and well-nourished. No distress. HEENT: Normocephalic and atraumatic.  Conjunctivae are normal.  No scleral icterus. Neck: Neck supple. Trachea midline. Cardiovascular: Normal rate, regular rhythm and intact distal pulses.  Pulmonary/chest: Effort normal and breath sounds normal. No wheezing, rales or rhonchi. Abdominal: Soft, nontender, nondistended. Bowel sounds active throughout.  Rectal:  No external lesions, no rash, nontender DRE, no masses  ANOSCOPY: Using a disposable, lubricated, slotted, self-illuminating anoscope, the rectum was intubated without difficulty. The trochar was removed and the ano-rectum was circumferentially inspected. There were very small internal hemorrhoids, RA and LL. There was no finding of an anorectal fissure. The rectal mucosa was not inflamed. No neoplasia or other pathology was identified. The inspection was well tolerated.  Extremities: no clubbing, cyanosis, or edema Neurological: Alert and oriented to person place and time. Skin: Skin is warm and dry.  Psychiatric: Normal mood and affect. Behavior is normal.  CT ABDOMEN AND PELVIS WITH CONTRAST (Sept 2013)   Technique:  Multidetector CT imaging of the abdomen and pelvis was performed following the standard protocol during bolus administration of intravenous  contrast.   Contrast: 158mL OMNIPAQUE IOHEXOL 300 MG/ML  SOLN   Comparison: 05/17/2009.   Findings: Lung bases show scattered scarring.  Heart size normal. No pericardial or pleural effusion.  Small hiatal hernia.   Liver, gallbladder, adrenal glands and right kidney are unremarkable.  An 8 mm nonobstructing stone is seen in the left kidney.  Spleen, pancreas, stomach and bowel are unremarkable. Question rectal prolapse.   There may be small bilateral inguinal hernias containing fat. Hysterectomy.  No pathologically enlarged lymph nodes.  No free fluid.  Scattered atherosclerotic calcification of the arterial vasculature without abdominal aortic aneurysm.  No worrisome lytic or sclerotic lesions.   IMPRESSION:   1.  No ventral hernia to explain the patient's periumbilical pain. 2.  Nonobstructing left renal stone. 3.  Probable rectal prolapse.     Original Report Authenticated By: Luretha Rued, M.D.    ASSESSMENT/PLAN:  77 year old female with a history of adenomatous colon polyps, history of hemorrhoids, remote ovarian cancer, GERD, hypertension who is seen to evaluate intermittent hemorrhoidal bleeding and heme positive stool  1. Constipation/internal hemorrhoids/rectal prolapse --constipation is the likely cause of her hard stool and difficulty with defecation.  I am very suspicious for mild rectal prolapse both by symptoms but also because this was seen on the CT scan from 5-1/2 years ago.  Her small internal hemorrhoids likely explain her intermittent and painless low-volume rectal bleeding.  She has already resolved her symptoms with intermittent laxative.  For now I will have her continue Senokot-S on an as-needed basis 2 to 3 days/week.  If she has further issues with hard stool, difficulty defecating or bleeding she is asked to notify me.  She voices understanding.  I will see her back in 6 months just to recheck this issue.  If symptoms worsen, particular related to  rectal prolapse, surgery could be considered but hopefully we can avoid this with medical management.  2.  GERD with distal esophageal stricture --well-controlled reflux disease.  She is switching to ranitidine 150 mg twice daily from PPI because she is concerned about long-term PPI risk.  Repeat dilation on an as-needed basis given that dysphagia improved after dilation last June  3.  Heme positive stool --found at primary care.  This is very likely related to problem #1.  Very recent upper endoscopy and colonoscopy and thus I am not repeating endoscopy at this time.  We will see her back in 6 months as above   TD:[facility], [address] [place], [place] [address]

## 2017-11-25 NOTE — Patient Instructions (Addendum)
Please follow up with Dr Hilarie Fredrickson in 6 months.  Please purchase the following medications over the counter and take as directed: Senokot S.-- Take 2-3 days per week  Continue ranitidine 150 mg twice daily as prescribed by your primary care physician.  If you are age 75 or older, your body mass index should be between 23-30. Your Body mass index is 32.52 kg/m. If this is out of the aforementioned range listed, please consider follow up with your Primary Care Provider.  If you are age 35 or younger, your body mass index should be between 19-25. Your Body mass index is 32.52 kg/m. If this is out of the aformentioned range listed, please consider follow up with your Primary Care Provider.   CC: Patient

## 2018-06-26 ENCOUNTER — Encounter: Payer: Self-pay | Admitting: Internal Medicine

## 2018-06-26 ENCOUNTER — Ambulatory Visit: Payer: Medicare HMO | Admitting: Internal Medicine

## 2018-06-26 ENCOUNTER — Encounter (INDEPENDENT_AMBULATORY_CARE_PROVIDER_SITE_OTHER): Payer: Self-pay

## 2018-06-26 ENCOUNTER — Encounter

## 2018-06-26 VITALS — BP 180/82 | HR 68 | Ht 62.0 in | Wt 172.0 lb

## 2018-06-26 DIAGNOSIS — K5900 Constipation, unspecified: Secondary | ICD-10-CM | POA: Diagnosis not present

## 2018-06-26 DIAGNOSIS — N200 Calculus of kidney: Secondary | ICD-10-CM | POA: Diagnosis not present

## 2018-06-26 DIAGNOSIS — I10 Essential (primary) hypertension: Secondary | ICD-10-CM

## 2018-06-26 DIAGNOSIS — R109 Unspecified abdominal pain: Secondary | ICD-10-CM

## 2018-06-26 DIAGNOSIS — K219 Gastro-esophageal reflux disease without esophagitis: Secondary | ICD-10-CM

## 2018-06-26 MED ORDER — OMEPRAZOLE 40 MG PO CPDR: 40.0000 mg | DELAYED_RELEASE_CAPSULE | Freq: Every day | ORAL | 2 refills | Status: DC

## 2018-06-26 NOTE — Progress Notes (Signed)
Subjective:    Patient ID: Grace [name], female    DOB: 12/28/1940, 77 y.o.   MRN: [id number]  HPI Grace [name] is a 77 year old female with a past medical history of adenomatous colon polyps, history of hemorrhoids, history of rectal prolapse, GERD, remote ovarian cancer, hypertension, kidney stones who is here for follow-up.  She is here alone today.  She reports that she is been having issues with recurrent GERD, nausea and queasiness after she eats.  Rare vomiting.  Burning from her epigastrium to her sternal notch.  3 weeks ago she developed bilateral upper back pain predominantly more left than right-sided.  Also some mild left upper quadrant pain.  She noticed blood in her stool and strained her stool and realize she was passing kidney stones.  No further hematuria.  No dysuria.  Mild left upper and left flank pain.  Right upper quadrant pain.  She is been using MiraLAX but not daily and senna every 2 to 3 days.  No recent rectal bleeding and when her stools are soft her hemorrhoidal and prolapse symptoms are significantly better.  Last week she submitted repeat fecal occult blood testing for primary care.  Of note primary care did change her omeprazole to ranitidine over concern about long-term PPI use.   Review of Systems As per HPI, otherwise negative  Current Medications, Allergies, Past Medical History, Past Surgical History, Family History and Social History were reviewed in [employer] record.     Objective:   Physical Exam BP (!) 180/82   Pulse 68   Ht 5\' 2"  (1.575 m)   Wt 172 lb (78 kg)   BMI 31.46 kg/m  Gen: awake, alert, NAD HEENT: anicteric, op clear CV: RRR, no mrg Pulm: CTA b/l Abd: soft, NT/ND, +BS throughout Ext: no c/c/e Neuro: nonfocal  Labs reviewed from care everywhere last labs dated 06/10/2018 Hemoglobin 12.7, hematocrit 38.4, platelet count 216, white count 7.3 CMP within normal limits with the exception of glucose 131    Assessment & Plan:  77 year old female with a past medical history of adenomatous colon polyps, history of hemorrhoids, history of rectal prolapse, GERD, remote ovarian cancer, hypertension, kidney stones who is here for follow-up  1.  Constipation/internal hemorrhoid/rectal prolapse --I recommended she be more consistent and use MiraLAX 17 g on a daily basis.  She can still use Senokot if needed up to twice daily.  Goal is for regular formed or soft stools daily without the need for straining.  She is asked to notify me if she has recurrent rectal bleeding.  2.  Left upper quadrant pain/left flank pain --likely related to known kidney stone.  Could also relate to #3.  If further dysuria/hematuria she needs to contact primary care or her urologist  3.  GERD/dyspepsia --all starting after changing from PPI to ranitidine twice daily.  We discussed long-term PPI therapy and clearly this was benefiting her symptoms.  We will switch back to omeprazole 40 mg once daily.  She can discontinue ranitidine but this can be used 150 mg in the evening if needed for breakthrough heartburn or dyspeptic symptoms.  If symptoms fail to improve with changing back to omeprazole she is asked to notify me.  She voices understanding  4.  History of adenomatous colon polyps --Ingal 6 mm adenoma in 2018, recall would be 5 years.  IBD testing by primary care pending though she does have known hemorrhoids.  5.  Hypertension --needs to contact primary care for  better hypertension control.  She has been using her bisoprolol and hydrochlorothiazide.  Very likely needs additional therapy such as calcium channel blocker or ACE inhibitor.  25 minutes spent with the patient today. Greater than 50% was spent in counseling and coordination of care with the patient

## 2018-06-26 NOTE — Patient Instructions (Signed)
Discontinue ranitidine.  We have sent the following medications to your pharmacy for you to pick up at your convenience: Omeprazole 40 mg daily 30 minutes before breakfast.  Please purchase the following medications over the counter and take as directed: Miralax- 17 grams dissolved in at least 8 ounces water/juice once daily.  Use your Senna as you have been.  If you are age 75 or older, your body mass index should be between 23-30. Your Body mass index is 31.46 kg/m. If this is out of the aforementioned range listed, please consider follow up with your Primary Care Provider.  If you are age 39 or younger, your body mass index should be between 19-25. Your Body mass index is 31.46 kg/m. If this is out of the aformentioned range listed, please consider follow up with your Primary Care Provider.

## 2018-07-23 ENCOUNTER — Telehealth: Payer: Self-pay | Admitting: Internal Medicine

## 2018-07-23 NOTE — Telephone Encounter (Signed)
Left message for pt that she needs to contact her PCP regarding elevated bp and they can refer her to cardiology.

## 2018-08-05 ENCOUNTER — Telehealth: Payer: Self-pay | Admitting: *Deleted

## 2018-08-05 NOTE — Telephone Encounter (Signed)
REFERRAL SENT TO SCHEDULING AND NOTES ON FILE FROM [place]

## 2018-09-30 ENCOUNTER — Encounter: Payer: Self-pay | Admitting: Internal Medicine

## 2018-10-07 ENCOUNTER — Telehealth: Payer: Self-pay | Admitting: Internal Medicine

## 2018-10-07 NOTE — Telephone Encounter (Signed)
Called pt   Left VM Pt seen by Dr Rozetta Nunnery at [facility]  Has seen cardiology in past as well With current outbreak of corona virusseeing only emergencies  Would like to reschedule for later n April to see m To pt to call or someone would call her to reschedule

## 2018-10-07 NOTE — Telephone Encounter (Signed)
Pt does not want to reschedule. She feels she has waited too long to be seen already

## 2018-10-11 ENCOUNTER — Telehealth: Payer: Self-pay | Admitting: Internal Medicine

## 2018-10-11 NOTE — Telephone Encounter (Signed)
Unable to leave VM I am postponing visit tomorrow with patient Please cancel appt I will have televisit up and running in the next few days and I will contact her at that tmie.

## 2018-10-12 ENCOUNTER — Ambulatory Visit: Payer: Medicare HMO | Admitting: Internal Medicine

## 2018-10-12 ENCOUNTER — Telehealth: Payer: Self-pay | Admitting: Internal Medicine

## 2018-10-12 DIAGNOSIS — R002 Palpitations: Secondary | ICD-10-CM

## 2018-10-12 NOTE — Addendum Note (Signed)
Addended by: Rodman Key on: 10/12/2018 10:17 AM   Modules accepted: Orders

## 2018-10-12 NOTE — Telephone Encounter (Signed)
Left message for patient to call back to let her know 14 day monitor (Zio) will be mailed to her home by the monitor company and to verify her home address.

## 2018-10-12 NOTE — Telephone Encounter (Signed)
Patient referred for palpitations    Had been seen by [facility] but wants to transfer care to [facility] She was last seen in cardiology 1 year ago (Jan 2019) Also a nistory of HTN (On Bystolic and HCTZ) Last seen in IM in jan 2020  The pt says palpitatons more noticeable in AM   When she wakes up.   Does not notice during the day   She has had on and off for a whle.  COncerned   "Like Im having a panic attack"  Recomm  1  Send Zio patch to pt   2 wk 2  Instructed pt to keep log of BP and P and mail in for evaluation (email)    Review of record it looks like BP an issue a while   She has declined sugg for amlodipine in past. 3   SOmeone from office will call to confirm monitor being arranged.

## 2018-10-12 NOTE — Telephone Encounter (Signed)
Left detailed message on VM that Dr. Harrington Challenger is postponing visit today until later this week. Adv that I am canceling appointment at 10:20 am this morning.  Adv we will contact her for a televisit that will be scheduled for later this week. Asked that pt call back to let me know she received the message.

## 2018-10-12 NOTE — Telephone Encounter (Signed)
Patient called back and confirmed address. Adv to call back around 4/1 if still has not received monitor.  Aware she will wear it for 14 days.

## 2018-10-12 NOTE — Telephone Encounter (Signed)
Dr. Harrington Challenger and patient spoke today. Appointment has been cancelled. See telephone encounter dated 10/12/18.

## 2019-03-08 ENCOUNTER — Ambulatory Visit: Payer: Medicare HMO | Admitting: Internal Medicine

## 2019-03-23 ENCOUNTER — Other Ambulatory Visit: Payer: Self-pay | Admitting: Internal Medicine

## 2019-07-01 ENCOUNTER — Encounter: Payer: Self-pay | Admitting: Internal Medicine

## 2019-07-01 ENCOUNTER — Ambulatory Visit: Payer: Medicare HMO | Admitting: Internal Medicine

## 2019-07-01 ENCOUNTER — Other Ambulatory Visit: Payer: Self-pay

## 2019-07-01 VITALS — BP 174/106 | HR 62 | Ht 62.0 in | Wt 178.0 lb

## 2019-07-01 DIAGNOSIS — I10 Essential (primary) hypertension: Secondary | ICD-10-CM | POA: Diagnosis not present

## 2019-07-01 MED ORDER — AMLODIPINE BESYLATE 5 MG PO TABS: 5.0000 mg | ORAL_TABLET | Freq: Every day | ORAL | 3 refills | Status: DC

## 2019-07-01 NOTE — Progress Notes (Signed)
Cardiology Office Note   Date:  07/01/2019   ID:  Grace [name], Grace [name] 27-Sep-1940, MRN [id number]  PCP:  Karleen Hampshire., MD  Cardiologist:   Dorris Carnes, MD   Pt presents for f/u of palpitations      History of Present Illness: Grace [name] is a 78 y.o. female with a history of HTN, GERD, hypothyroidism and thyoid nodule as well as palpitations.   I was set to see her last spring at start of pandemic   She wanted to transfer care.   Had been seen in cardiology in 2019 by report   Her April visit was at start of pandemic   Virtual visits only      SPoke with pt on phone about palpitations  She said she would wake up and notice them   Not during day   On and offf   Feeling like going to have "Panic attack" A Zio patch was recommended   Alos recomm f/u of BP    The pt comes in today  She said she received Zio patch late   Was moving   Wore after Thanksgiving for 1 day   Having problem with it  Put back in mail     Talking to the patient she says she usually feels her heart rate pick up when she gets up and will race a little sometimes with just sitting down it will race the episodes are not long.  Maximum 30 seconds.  No dizziness.  No shortness of breath.  She will feel "scared" when not having spells she denies any discomfort or shortness of breath.  She does say she is more fatigued.  She was seen by PCP recently.  Blood pressure was noted to be elevated.  She does not take her blood pressure at home    Current Meds  Medication Sig  . aspirin EC 81 MG tablet Take 81 mg by mouth every other day.   . Calcium Carbonate-Vitamin D (CALCIUM + D PO) Take 1 tablet by mouth daily.   . Cholecalciferol (VITAMIN D-3) 1000 units CAPS Take 1 capsule by mouth 5 (five) times daily.  . Coenzyme Q10 (CO Q-10) 100 MG CAPS Take 100 mg by mouth 4 (four) times a week.   . Cyanocobalamin (VITAMIN B-12) 5000 MCG TBDP Take by mouth.  . hydrochlorothiazide (HYDRODIURIL) 25 MG tablet Take 25 mg by mouth  daily. (0900)  . levothyroxine (SYNTHROID, LEVOTHROID) 75 MCG tablet Take 75 mcg by mouth daily. (0800)  . LUTEIN PO Take 1 tablet by mouth 4 (four) times a week.  . magnesium gluconate (MAGONATE) 500 MG tablet Take 400 mg by mouth 2 (two) times daily.  . Multiple Vitamin (MULTIVITAMIN) tablet Take 1 tablet by mouth daily. One-a-day  . Nebivolol HCl (BYSTOLIC) 20 MG TABS Take by mouth.  Marland Kitchen omeprazole (PRILOSEC) 40 MG capsule TAKE ONE CAPSULE BY MOUTH DAILY  . vitamin C (ASCORBIC ACID) 500 MG tablet Take 500 mg by mouth daily.  . vitamin k 100 MCG tablet Take 100 mcg by mouth daily.     Allergies:   Zocor [simvastatin]   Past Medical History:  Diagnosis Date  . Abdominal pain   . Abdominal tenderness, unspecified site   . Adrenal nodule ([place])   . Arthritis   . Cancer (HCC)    Ovarian  . Cholecystitis, unspecified   . Constipation    occasional  . Dysphagia, unspecified(787.20)   . Ganglion, right wrist   .  GERD (gastroesophageal reflux disease)   . Hiatal hernia   . Hx of ovarian cancer   . Hypertension   . Hypothyroidism   . Knee pain    BILATERAL  . Multiple lung nodules on CT   . Neoplasm of uncertain behavior of skin   . 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 Palpitations   . Primary osteoarthritis of both knees   . Rectal prolapse   . RLQ abdominal pain   . Thyroid nodule   . Trigger finger, left ring finger   . Tubular adenoma of colon   . Ventral hernia     Past Surgical History:  Procedure Laterality Date  . ABDOMINAL HYSTERECTOMY  1990's   partial  . BALLOON DILATION N/A 01/06/2017   Procedure: BALLOON DILATION;  Surgeon: Garlan Fair, MD;  Location: [facility];  Service: Endoscopy;  Laterality: N/A;  . COLONOSCOPY WITH PROPOFOL N/A 01/06/2017   Procedure: COLONOSCOPY WITH PROPOFOL;  Surgeon: Garlan Fair, MD;  Location: [facility];  Service: Endoscopy;  Laterality: N/A;  . ESOPHAGOGASTRODUODENOSCOPY (EGD) WITH  PROPOFOL N/A 01/06/2017   Procedure: ESOPHAGOGASTRODUODENOSCOPY (EGD) WITH PROPOFOL;  Surgeon: Garlan Fair, MD;  Location: [facility];  Service: Endoscopy;  Laterality: N/A;  . HERNIA REPAIR  12/2002  . OOPHORECTOMY  07/2003     Social History:  The patient  reports that she has never smoked. She has never used smokeless tobacco. She reports that she does not drink alcohol or use drugs.   Family History:  The patient's family history includes Heart attack in her father and mother.    ROS:  Please see the history of present illness. All other systems are reviewed and  Negative to the above problem except as noted.    PHYSICAL EXAM: VS:  BP (!) 174/106   Pulse 62   Ht 5\' 2"  (1.575 m)   Wt 178 lb (80.7 kg)   BMI 32.56 kg/m   GEN: Morbidly obese 78 year old in no acute distress  HEENT: normal  Neck: no JVD, carotid bruits, or masses Cardiac: RRR; no murmurs, rubs, or gallops,no edema  Respiratory:  clear to auscultation bilaterally, normal work of breathing GI: soft, nontender, nondistended, + BS  No hepatomegaly  MS: no deformity Moving all extremities   Skin: warm and dry, no rash Neuro:  Strength and sensation are intact Psych: euthymic mood, full affect   EKG:  EKG is ordered today.  SR 62 bpm  Nonspecific ST changes     Lipid Panel No results found for: CHOL, TRIG, HDL, CHOLHDL, VLDL, LDLCALC, LDLDIRECT    Wt Readings from Last 3 Encounters:  07/01/19 178 lb (80.7 kg)  06/26/18 172 lb (78 kg)  11/25/17 177 lb 12.8 oz (80.6 kg)      ASSESSMENT AND PLAN:  1.  Palpitations talking to the patient they do not sound hemodynamically destabilizing.  Very brief actually.  On examination today I do not hear any skips.  Unfortunately the Zio patch was a problem we will need to check to see if the company actually received the monitor back.  I would follow for now.  Stay hydrated  2 hypertension this is more problematic.  I would add amlodipine 5 mg to her regimen.  Will  recommend follow-up in 6 to 8 weeks.  3 fatigue.  I am not convinced is due to cardiac problems.  Would follow  4.  Dyslipidemia for now I would not change her regimen.  LDL  131.  HDL 43.  Note her hemoglobin A1c was 6.8.  Watch diet.   Current medicines are reviewed at length with the patient today.  The patient does not have concerns regarding medicines.  Signed, Dorris Carnes, MD  07/01/2019 10:35 AM    [address], [place], [place]  [address] Phone: [phone]; Fax: [phone]

## 2019-07-01 NOTE — Patient Instructions (Signed)
Medication Instructions:  Your physician has recommended you make the following change in your medication:  1.) start amlodipine (Norvasc) 5 mg once a day  *If you need a refill on your cardiac medications before your next appointment, please call your pharmacy*  Lab Work: none If you have labs (blood work) drawn today and your tests are completely normal, you will receive your results only by: Marland Kitchen MyChart Message (if you have MyChart) OR . A paper copy in the mail If you have any lab test that is abnormal or we need to change your treatment, we will call you to review the results.  Testing/Procedures: none  Follow-Up: At [facility], you and your health needs are our priority.  As part of our continuing mission to provide you with exceptional heart care, we have created designated Provider Care Teams.  These Care Teams include your primary Cardiologist (physician) and Advanced Practice Providers (APPs -  Physician Assistants and Nurse Practitioners) who all work together to provide you with the care you need, when you need it.  Your next appointment:   6 week(s)  The format for your next appointment:   In Person or Virtual  Provider:   You may see Dr. Dorris Carnes or one of the following Advanced Practice Providers on your designated Care Team:    Richardson Dopp, PA-C  [place], [place]  Daune Perch, NP   Other Instructions

## 2019-08-05 ENCOUNTER — Telehealth: Payer: Self-pay | Admitting: *Deleted

## 2019-08-05 ENCOUNTER — Other Ambulatory Visit: Payer: Self-pay | Admitting: *Deleted

## 2019-08-05 DIAGNOSIS — R002 Palpitations: Secondary | ICD-10-CM

## 2019-08-05 NOTE — Telephone Encounter (Signed)
Patient called back.  She would prefer to have the monitor applied on Friday 08/13/2019 after her 2:20PM appointment with Dr. Harrington Challenger.  She does not want it mailed to her home because she is having problems receiving her mail. Patient will be scheduled for 3:00, 08/13/2019 for her 14 day ZIO XT redo.  [employer] representative has been contacted to cancel charges for [id number] which fell off after 2 hours.

## 2019-08-05 NOTE — Telephone Encounter (Deleted)
Patient enrolled for [employer] to mail a second 14 day ZIO XT long term holter monitor to her home.  First monitor [id number] fell off after 2 hours.  [employer] representative contacted to assure patient will not be charged for first monitor.  Instructions reviewed briefly as they are included in the monitor kit.

## 2019-08-11 NOTE — Progress Notes (Signed)
Cardiology Office Note   Date:  08/13/2019   ID:  Kerma, Dalberg May 17, 1941, MRN [id number]  PCP:  Karleen Hampshire., MD  Cardiologist:   Dorris Carnes, MD   Pt presents for f/u of palpitations      History of Present Illness: Grace [name] is a 79 y.o. female with a history of HTN, GERD, hypothyroidism and thyoid nodule as well as palpitations.   I was set to see her last spring at start of pandemic   She wanted to transfer care.   Had been seen in cardiology in 2019 by report   Her April visit was at start of pandemic   Virtual visits only      SPoke with pt on phone about palpitations  She said she would wake up and notice them   Not during day   On and offf   Feeling like going to have "Panic attack" The patient was seen in December   She said she received Zio patch late   Was moving   Wore after Thanksgiving for 1 day   Having problem with it  Put back in mail    Stated she felt heart race a little   Maximum 30 seconds  No dizziness.    Seen by PCP   BP was elevated     When I saw her BP was elevated and I recomm adding amlodipine  The pt says since starting amlodipine she has had swelling in her legs.  She says she feels more short of breath she thinks because of the swelling.  She has some palpitations some that can wake her up no real change.  She does note if she does not take her magnesium supplement she will have more palpitations. .     Current Meds  Medication Sig  . amLODipine (NORVASC) 5 MG tablet Take 1 tablet (5 mg total) by mouth daily.  Marland Kitchen aspirin EC 81 MG tablet Take 81 mg by mouth every other day.   . Calcium Carbonate-Vitamin D (CALCIUM + D PO) Take 2,000 mg by mouth daily.   . Cholecalciferol (VITAMIN D-3) 1000 units CAPS Take 1 capsule by mouth 5 (five) times daily.  . Coenzyme Q10 (CO Q-10) 100 MG CAPS Take 100 mg by mouth 4 (four) times a week.   . Cyanocobalamin (VITAMIN B-12) 5000 MCG TBDP Take by mouth.  . hydrochlorothiazide (HYDRODIURIL) 25 MG  tablet Take 25 mg by mouth daily. (0900)  . levothyroxine (SYNTHROID, LEVOTHROID) 75 MCG tablet Take 75 mcg by mouth daily. (0800)  . LUTEIN PO Take 20 mg by mouth 4 (four) times a week.   . magnesium gluconate (MAGONATE) 500 MG tablet Take 500 mg by mouth 3 (three) times daily.   . Multiple Vitamin (MULTIVITAMIN) tablet Take 1 tablet by mouth daily. One-a-day  . omeprazole (PRILOSEC) 40 MG capsule TAKE ONE CAPSULE BY MOUTH DAILY  . vitamin C (ASCORBIC ACID) 500 MG tablet Take 500 mg by mouth daily.  . vitamin k 100 MCG tablet Take 100 mcg by mouth daily.     Allergies:   Zocor [simvastatin]   Past Medical History:  Diagnosis Date  . Abdominal pain   . Abdominal tenderness, unspecified site   . Adrenal nodule ([place])   . Arthritis   . Cancer (HCC)    Ovarian  . Cholecystitis, unspecified   . Constipation    occasional  . Dysphagia, unspecified(787.20)   . Ganglion, right wrist   . GERD (  gastroesophageal reflux disease)   . Hiatal hernia   . Hx of ovarian cancer   . Hypertension   . Hypothyroidism   . Knee pain    BILATERAL  . Multiple lung nodules on CT   . Neoplasm of uncertain behavior of skin   . 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 Palpitations   . Primary osteoarthritis of both knees   . Rectal prolapse   . RLQ abdominal pain   . Thyroid nodule   . Trigger finger, left ring finger   . Tubular adenoma of colon   . Ventral hernia     Past Surgical History:  Procedure Laterality Date  . ABDOMINAL HYSTERECTOMY  1990's   partial  . BALLOON DILATION N/A 01/06/2017   Procedure: BALLOON DILATION;  Surgeon: Garlan Fair, MD;  Location: [facility];  Service: Endoscopy;  Laterality: N/A;  . COLONOSCOPY WITH PROPOFOL N/A 01/06/2017   Procedure: COLONOSCOPY WITH PROPOFOL;  Surgeon: Garlan Fair, MD;  Location: [facility];  Service: Endoscopy;  Laterality: N/A;  . ESOPHAGOGASTRODUODENOSCOPY (EGD) WITH PROPOFOL N/A 01/06/2017    Procedure: ESOPHAGOGASTRODUODENOSCOPY (EGD) WITH PROPOFOL;  Surgeon: Garlan Fair, MD;  Location: [facility];  Service: Endoscopy;  Laterality: N/A;  . HERNIA REPAIR  12/2002  . OOPHORECTOMY  07/2003     Social History:  The patient  reports that she has never smoked. She has never used smokeless tobacco. She reports that she does not drink alcohol or use drugs.   Family History:  The patient's family history includes Heart attack in her father and mother.    ROS:  Please see the history of present illness. All other systems are reviewed and  Negative to the above problem except as noted.    PHYSICAL EXAM: VS:  BP (!) 168/90   Pulse 93   Ht 5\' 2"  (1.575 m)   Wt 179 lb (81.2 kg)   SpO2 96%   BMI 32.74 kg/m   GEN: Morbidly obese 79 year old in no acute distress  HEENT: normal  Neck: no JVD, carotid bruits, or masses Cardiac: RRR; no murmurs, rubs, or gallops, 1+ edema  Respiratory:  clear to auscultation bilaterally, normal work of breathing GI: soft, nontender, nondistended, + BS  No hepatomegaly  MS: no deformity Moving all extremities   Skin: warm and dry, no rash Neuro:  Strength and sensation are intact Psych: euthymic mood, full affect   EKG:  EKG is not ordered today.     Lipid Panel No results found for: CHOL, TRIG, HDL, CHOLHDL, VLDL, LDLCALC, LDLDIRECT    Wt Readings from Last 3 Encounters:  08/13/19 179 lb (81.2 kg)  07/01/19 178 lb (80.7 kg)  06/26/18 172 lb (78 kg)      ASSESSMENT AND PLAN:  1.  Palpitations episodes are brief.  Patient had Zio patch but had problems with  We will place today.   2 hypertension she is not tolerating amlodipine which was added at last visit has swelling.  I would go ahead and discontinue note she had been on Bystolic in the past we will get labs that she had done at Dr. Gerald Stabs office to look at renal function consider other agents once labs are reviewed.     3 fatigue.  I am not convinced is due to cardiac  problems.  Would follow  4.  Dyslipidemia for now I would not change her regimen.  LDL 131.  HDL 43.  Note her hemoglobin A1c was  6.8.  Watch diet.  F/U once labs received , med changes made   Current medicines are reviewed at length with the patient today.  The patient does not have concerns regarding medicines.  Signed, Dorris Carnes, MD  08/13/2019 2:56 PM    [address], [place], [place]  [address] Phone: [phone]; Fax: [phone]

## 2019-08-13 ENCOUNTER — Encounter: Payer: Self-pay | Admitting: Internal Medicine

## 2019-08-13 ENCOUNTER — Other Ambulatory Visit (INDEPENDENT_AMBULATORY_CARE_PROVIDER_SITE_OTHER): Payer: Medicare HMO

## 2019-08-13 ENCOUNTER — Ambulatory Visit: Payer: Medicare HMO | Admitting: Internal Medicine

## 2019-08-13 ENCOUNTER — Other Ambulatory Visit: Payer: Self-pay

## 2019-08-13 VITALS — BP 168/90 | HR 93 | Ht 62.0 in | Wt 179.0 lb

## 2019-08-13 DIAGNOSIS — E782 Mixed hyperlipidemia: Secondary | ICD-10-CM

## 2019-08-13 DIAGNOSIS — I10 Essential (primary) hypertension: Secondary | ICD-10-CM | POA: Diagnosis not present

## 2019-08-13 DIAGNOSIS — R002 Palpitations: Secondary | ICD-10-CM

## 2019-08-13 NOTE — Patient Instructions (Addendum)
Medication Instructions:  Stop amlodipine *If you need a refill on your cardiac medications before your next appointment, please call your pharmacy*  Lab Work: Will get labs from Dr. Gerald Stabs office If you have labs (blood work) drawn today and your tests are completely normal, you will receive your results only by: Marland Kitchen MyChart Message (if you have MyChart) OR . A paper copy in the mail If you have any lab test that is abnormal or we need to change your treatment, we will call you to review the results.  Testing/Procedures: Monitor today   Follow-Up: Follow up with your physician will depend on test results.  Other Instructions Message to medical records to try to obtain any recent labs.  Last labs in Care Everywhere are June 11, 2019.

## 2019-08-25 ENCOUNTER — Telehealth: Payer: Self-pay | Admitting: Internal Medicine

## 2019-08-25 NOTE — Telephone Encounter (Signed)
Medical records requested from [facility] Adline Mango, MD). 08/25/19 vlm

## 2019-09-02 ENCOUNTER — Other Ambulatory Visit: Payer: Self-pay | Admitting: Internal Medicine

## 2019-09-02 DIAGNOSIS — Z1231 Encounter for screening mammogram for malignant neoplasm of breast: Secondary | ICD-10-CM

## 2019-09-02 DIAGNOSIS — M810 Age-related osteoporosis without current pathological fracture: Secondary | ICD-10-CM

## 2019-09-10 ENCOUNTER — Telehealth: Payer: Self-pay | Admitting: Internal Medicine

## 2019-09-10 MED ORDER — METOPROLOL SUCCINATE ER 25 MG PO TB24: 25.0000 mg | ORAL_TABLET | Freq: Every day | ORAL | 3 refills | Status: DC

## 2019-09-10 NOTE — Telephone Encounter (Signed)
Patient informed of results/review and recommendations by Dr. Harrington Challenger.  She would like to try Toprol XL 25 mg daily.  A prescription has been sent to her pharmacy.  Pt requested a copy of report be mailed to her home for her records.  I sent a message to HIM to assist.  Pt reporting a left sided upper abdominal pain that is new for her.  It is not associated with the skipped heartbeats she feels.  She states she has a large abdominal hernia as well and feels it is GI related and is going to get back to see Dr. Hilarie Fredrickson soon.

## 2019-09-10 NOTE — Telephone Encounter (Signed)
-----   Message from [name], MD sent at 09/09/2019  1:29 PM EST ----- Monitor showed SR   She had some isolated skips. There were short bursts of PAT/SVT  Nothing threatening  Some of triggered events associated with this   SOmetimes the triggered times were with normal rhythm Could try low dose Toprol XL 25 mg

## 2019-09-10 NOTE — Telephone Encounter (Signed)
Patient returning [name] call in regards to monitor results.

## 2019-09-30 ENCOUNTER — Telehealth: Payer: Self-pay | Admitting: Internal Medicine

## 2019-09-30 NOTE — Telephone Encounter (Signed)
Patient states she is requesting monitor results. Please return call to discuss.

## 2019-10-01 NOTE — Telephone Encounter (Signed)
Spoke with patient. Transferred her to [place] in Med. Rec.

## 2020-01-12 ENCOUNTER — Other Ambulatory Visit (INDEPENDENT_AMBULATORY_CARE_PROVIDER_SITE_OTHER): Payer: Medicare HMO

## 2020-01-12 ENCOUNTER — Ambulatory Visit (INDEPENDENT_AMBULATORY_CARE_PROVIDER_SITE_OTHER): Payer: Medicare HMO | Admitting: Nurse Practitioner

## 2020-01-12 ENCOUNTER — Encounter: Payer: Self-pay | Admitting: Nurse Practitioner

## 2020-01-12 VITALS — BP 160/90 | HR 84 | Ht 58.5 in | Wt 164.2 lb

## 2020-01-12 DIAGNOSIS — R1084 Generalized abdominal pain: Secondary | ICD-10-CM

## 2020-01-12 DIAGNOSIS — M6208 Separation of muscle (nontraumatic), other site: Secondary | ICD-10-CM

## 2020-01-12 DIAGNOSIS — R112 Nausea with vomiting, unspecified: Secondary | ICD-10-CM

## 2020-01-12 DIAGNOSIS — M533 Sacrococcygeal disorders, not elsewhere classified: Secondary | ICD-10-CM | POA: Diagnosis not present

## 2020-01-12 LAB — CBC WITH DIFFERENTIAL/PLATELET
Basophils Absolute: 0.1 10*3/uL (ref 0.0–0.1)
Basophils Relative: 0.8 % (ref 0.0–3.0)
Eosinophils Absolute: 0.4 10*3/uL (ref 0.0–0.7)
Eosinophils Relative: 4.5 % (ref 0.0–5.0)
HCT: 35.5 % — ABNORMAL LOW (ref 36.0–46.0)
Hemoglobin: 12.1 g/dL (ref 12.0–15.0)
Lymphocytes Relative: 12.1 % (ref 12.0–46.0)
Lymphs Abs: 1.1 10*3/uL (ref 0.7–4.0)
MCHC: 34.1 g/dL (ref 30.0–36.0)
MCV: 81.4 fl (ref 78.0–100.0)
Monocytes Absolute: 0.8 10*3/uL (ref 0.1–1.0)
Monocytes Relative: 9 % (ref 3.0–12.0)
Neutro Abs: 6.7 10*3/uL (ref 1.4–7.7)
Neutrophils Relative %: 73.6 % (ref 43.0–77.0)
Platelets: 221 10*3/uL (ref 150.0–400.0)
RBC: 4.35 Mil/uL (ref 3.87–5.11)
RDW: 15.2 % (ref 11.5–15.5)
WBC: 9.1 10*3/uL (ref 4.0–10.5)

## 2020-01-12 LAB — COMPREHENSIVE METABOLIC PANEL
ALT: 10 U/L (ref 0–35)
AST: 12 U/L (ref 0–37)
Albumin: 4.4 g/dL (ref 3.5–5.2)
Alkaline Phosphatase: 105 U/L (ref 39–117)
BUN: 14 mg/dL (ref 6–23)
CO2: 32 mEq/L (ref 19–32)
Calcium: 10.3 mg/dL (ref 8.4–10.5)
Chloride: 96 mEq/L (ref 96–112)
Creatinine, Ser: 0.78 mg/dL (ref 0.40–1.20)
GFR: 71.53 mL/min (ref >60.00)
Glucose, Bld: 138 mg/dL — ABNORMAL HIGH (ref 70–99)
Potassium: 3.7 mEq/L (ref 3.5–5.1)
Sodium: 135 mEq/L (ref 135–145)
Total Bilirubin: 0.6 mg/dL (ref 0.2–1.2)
Total Protein: 7.2 g/dL (ref 6.0–8.3)

## 2020-01-12 NOTE — Progress Notes (Signed)
01/12/2020 Grace [name] [id number] October 25, 1940   Chief Complaint: abdominal pain, tail bone pain  History of Present Illness: Grace [name]. Grace [name] is a 79 year old female with a  past medical history hypertension, hypothyroidism, thyroid nodule, ovarian cancer 2005 s/p BSO no chemo, hysterectomy 1990's, osteoarthritis, vitamin B12 deficiency, kidney stones, GERD, rectal prolapse and colon polyps. She presents today for further evaluation for central abdominal pain. She describes feeling as if her intestines are up high in her abdomen, sometimes feels like it is difficulty to take in a deep breath. " I feel like my intestines are not where they should be". She has intermittent nausea, no vomiting. Her hemorrhoids are no longer protruding out. She is having pain to her tailbone, sometimes is painful to sit down. No obvious trauma or fall. She is passing thinner formed stools for the past 1 to 2 months but does not feel constipated. She takes Miralax daily.  No rectal bleeding or black stools. Weight stable 160 - 162lbs. Her most recent colonoscopy was 01/06/2017, two tubular adenomatous polyps were removed from the ascending and transverse colon. An EGD was done on the same date which showed a benign appearing esophageal stenosis which was dilated otherwise was normal. She denies having any heartburn or dysphagia. She is taking Omeprazole 40mg  daily.    Current Outpatient Medications on File Prior to Visit  Medication Sig Dispense Refill  . aspirin EC 81 MG tablet Take 81 mg by mouth every other day.     . Calcium Carbonate-Vitamin D (CALCIUM + D PO) Take 2,000 mg by mouth daily.     . Cholecalciferol (VITAMIN D-3) 1000 units CAPS Take 1 capsule by mouth 5 (five) times daily.    . Coenzyme Q10 (CO Q-10) 100 MG CAPS Take 100 mg by mouth 4 (four) times a week.     . Cyanocobalamin (VITAMIN B-12) 5000 MCG TBDP Take by mouth.    . hydrochlorothiazide (HYDRODIURIL) 25 MG tablet Take 25 mg by mouth  daily. (0900)    . levothyroxine (SYNTHROID, LEVOTHROID) 75 MCG tablet Take 75 mcg by mouth daily. (0800)    . LUTEIN PO Take 20 mg by mouth 4 (four) times a week.     . magnesium gluconate (MAGONATE) 500 MG tablet Take 500 mg by mouth 3 (three) times daily.     . metoprolol succinate (TOPROL XL) 25 MG 24 hr tablet Take 1 tablet (25 mg total) by mouth daily. 90 tablet 3  . Multiple Vitamin (MULTIVITAMIN) tablet Take 1 tablet by mouth daily. One-a-day    . omeprazole (PRILOSEC) 40 MG capsule TAKE ONE CAPSULE BY MOUTH DAILY 90 capsule 1  . vitamin C (ASCORBIC ACID) 500 MG tablet Take 500 mg by mouth daily.    . vitamin k 100 MCG tablet Take 100 mcg by mouth daily.     No current facility-administered medications on file prior to visit.    Allergies  Allergen Reactions  . Zocor [Simvastatin] Nausea And Vomiting and Other (See Comments)    Muscle weakness, extremely sick.  . Amlodipine Swelling     - Current Medications, Allergies, Past Medical History, Past Surgical History, Family History and Social History were reviewed in [employer] record.   Physical Exam: BP (!) 160/90 (BP Location: Left Arm, Patient Position: Sitting, Cuff Size: Normal)   Pulse 84   Ht 4' 10.5" (1.486 m) Comment: height measured without shoes  Wt 164 lb 4 oz (74.5 kg)  BMI 33.74 kg/m   Height 5\' 2"  08/20/2017  General: 79 year old female with a short stature and mild kyphosis.  Head: Normocephalic and atraumatic. Eyes: No scleral icterus. Conjunctiva pink . Ears: Normal auditory acuity. Mouth: Dentition intact. No ulcers or lesions.  Lungs: Clear throughout to auscultation. Heart: Regular rate and rhythm, no murmur. Abdomen: Soft, nontender and nondistended. No masses or hepatomegaly. Normal bowel sounds x 4 quadrants.  Rectal: circumferential external hemorrhoids without inflammation, no obvious rectal prolapse. No mass. Golden brown soft stool in rectal vault guaiac negative.  Coccyx mildly tender with somewhat curved tip on palpation. Small oval shaped skin tear/abrasion 2 cm above the coccyx, no exudate or erythema.  Musculoskeletal: Symmetrical with no gross deformities. Extremities: No edema. Neurological: Alert oriented x 4. No focal deficits.  Psychological: Alert and cooperative. Normal mood and affect  Assessment and Recommendations:  9. 79 year old female with a remote history of ovarian cancer 2005 presents with vague variable generalized abdominal pain describes feeling as if her intestines have moved higher in her abdominal cavity, her short stature, mot likely has  loss of height + mild kyphosis may be contributing to her symptoms.  -Abdominal/pelvic CT with oral and IV contrast  -CBC, CMP  2. Change in bowel pattern, narrow stools. -Continue Miralax -Await CTAP results   3. Coccydynia, most likely arthritis. Small abrasion/superficial ulcer 2 cm above coccyx. -Tylenol PRN -Avoid prolonged sitting  -Avoid aggressive wiping -Consider xray of coccyx if pain persists  -Follow up with PCP regarding small abrasion/superficial ulceration above coccyx, refer to dermatology if persists/worsens   4. History of tubular adenomatous colon polyps  -Next colonoscopy due

## 2020-01-12 NOTE — Patient Instructions (Signed)
If you are age 77 or older, your body mass index should be between 23-30. Your Body mass index is 33.74 kg/m. If this is out of the aforementioned range listed, please consider follow up with your Primary Care Provider.  If you are age 16 or younger, your body mass index should be between 19-25. Your Body mass index is 33.74 kg/m. If this is out of the aformentioned range listed, please consider follow up with your Primary Care Provider.   Your provider has requested that you go to the basement level for lab work before leaving today. Press "B" on the elevator. The lab is located at the first door on the left as you exit the elevator.  _____________________________________________________ Grace [name] are scheduled for a CT of the abdomen and pelvis at [facility] on [address] [place]  Your appointment is 01/28/2020 at 11:40 am. You will need to stop at there office to pick up your contrast prior to the exam.  ________________________________________________________  Call our office if your symptoms worsen, Continue with Miralax Follow up with your PCP regarding small skin tear above your tailbone. Follow up with Dr Hilarie Fredrickson in 6 weeks  Due to recent changes in healthcare laws, you may see the results of your imaging and laboratory studies on MyChart before your provider has had a chance to review them.  We understand that in some cases there may be results that are confusing or concerning to you. Not all laboratory results come back in the same time frame and the provider may be waiting for multiple results in order to interpret others.  Please give [name] 48 hours in order for your provider to thoroughly review all the results before contacting the office for clarification of your results.

## 2020-01-13 DIAGNOSIS — M533 Sacrococcygeal disorders, not elsewhere classified: Secondary | ICD-10-CM | POA: Insufficient documentation

## 2020-01-13 DIAGNOSIS — R1084 Generalized abdominal pain: Secondary | ICD-10-CM | POA: Insufficient documentation

## 2020-01-19 NOTE — Progress Notes (Signed)
Addendum: Reviewed and agree with assessment and management plan. Nolan Tuazon M, MD  

## 2020-01-28 ENCOUNTER — Ambulatory Visit
Admission: RE | Admit: 2020-01-28 | Discharge: 2020-01-28 | Disposition: A | Discharge status: Home / Self Care | Payer: Medicare HMO | Source: Ambulatory Visit | Attending: Nurse Practitioner | Admitting: Nurse Practitioner

## 2020-01-28 ENCOUNTER — Other Ambulatory Visit: Payer: Self-pay

## 2020-01-28 DIAGNOSIS — M6208 Separation of muscle (nontraumatic), other site: Secondary | ICD-10-CM

## 2020-01-28 DIAGNOSIS — R1084 Generalized abdominal pain: Secondary | ICD-10-CM

## 2020-01-28 DIAGNOSIS — R112 Nausea with vomiting, unspecified: Secondary | ICD-10-CM

## 2020-01-28 MED ORDER — IOPAMIDOL (ISOVUE-300) INJECTION 61%
100.0000 mL | Freq: Once | INTRAVENOUS | Status: AC | PRN
  Administered 2020-01-28: 100 mL via INTRAVENOUS

## 2020-02-28 ENCOUNTER — Encounter: Payer: Self-pay | Admitting: *Deleted

## 2020-03-22 ENCOUNTER — Ambulatory Visit (INDEPENDENT_AMBULATORY_CARE_PROVIDER_SITE_OTHER): Payer: Medicare HMO | Admitting: Internal Medicine

## 2020-03-22 ENCOUNTER — Encounter: Payer: Self-pay | Admitting: Internal Medicine

## 2020-03-22 VITALS — BP 184/88 | HR 90 | Ht 58.5 in | Wt 172.0 lb

## 2020-03-22 DIAGNOSIS — M533 Sacrococcygeal disorders, not elsewhere classified: Secondary | ICD-10-CM

## 2020-03-22 DIAGNOSIS — K5900 Constipation, unspecified: Secondary | ICD-10-CM | POA: Diagnosis not present

## 2020-03-22 DIAGNOSIS — R1084 Generalized abdominal pain: Secondary | ICD-10-CM | POA: Diagnosis not present

## 2020-03-22 NOTE — Patient Instructions (Signed)
Purchase a donut to sit on.  Please purchase the following medications over the counter and take as directed: Menan as needed to top of the buttocks.  If you are age 78 or older, your body mass index should be between 23-30. Your Body mass index is 35.34 kg/m. If this is out of the aforementioned range listed, please consider follow up with your Primary Care Provider.  If you are age 86 or younger, your body mass index should be between 19-25. Your Body mass index is 35.34 kg/m. If this is out of the aformentioned range listed, please consider follow up with your Primary Care Provider.   Due to recent changes in healthcare laws, you may see the results of your imaging and laboratory studies on MyChart before your provider has had a chance to review them.  We understand that in some cases there may be results that are confusing or concerning to you. Not all laboratory results come back in the same time frame and the provider may be waiting for multiple results in order to interpret others.  Please give [name] 48 hours in order for your provider to thoroughly review all the results before contacting the office for clarification of your results.

## 2020-03-22 NOTE — Progress Notes (Signed)
Subjective:    Patient ID: Grace [name], female    DOB: 17-Apr-1941, 79 y.o.   MRN: [id number]  HPI Grace [name] is a 79 year old female with a past medical history of adenomatous colon polyps, hemorrhoids, history of rectal prolapse, GERD, remote ovarian cancer, hypertension, kidney stones who is here for follow-up.  She is here alone today and was last seen by Carl Best, NP on 01/12/2020.  At her last visit she was having vague and variable generalized abdominal pain including upper abdominal pain worse with movement and bending over.  A CT scan was performed of the abdomen pelvis with contrast.  We reviewed this together today.  See below for details.  She reports she has been feeling better.  She will occasionally have upper abdominal pain under her left rib cage which is worse when she is sitting.  It has not affected her appetite.  She is having regular bowel movements as long as she is using her MiraLAX daily.  She will occasionally also use senna S if she feels like she needs to have a larger evacuation.  She has not had rectal bleeding or melena.  She does not have any anal or rectal pain.  She does have discomfort when sitting at her coccyx which is where [name] noticed a small skin lesion/tear.   Review of Systems As per HPI, otherwise negative  Current Medications, Allergies, Past Medical History, Past Surgical History, Family History and Social History were reviewed in [employer] record.     Objective:   Physical Exam BP (!) 184/88   Pulse 90   Ht 4' 10.5" (1.486 m)   Wt 172 lb (78 kg)   BMI 35.34 kg/m  Gen: awake, alert, NAD Abd: soft, NT/ND, +BS throughout Rectal: External exam only with small prolapsed internal hemorrhoids with skin tags, nontender, over the coccyx at the top of the intergluteal cleft there is a pinpoint skin tear without frank ulceration or surrounding erythema and no purulence Ext: no c/c/e Neuro:  nonfocal  CT ABDOMEN AND PELVIS WITH CONTRAST   TECHNIQUE: Multidetector CT imaging of the abdomen and pelvis was performed using the standard protocol following bolus administration of intravenous contrast.   CONTRAST:  1103mL ISOVUE-300 IOPAMIDOL (ISOVUE-300) INJECTION 61%   COMPARISON:  10/08/2016 from [facility]   FINDINGS: Lower Chest: No acute findings. Bibasilar scarring. Stable nodular density in medial right lower lobe.   Hepatobiliary: No hepatic masses identified. A mild diffuse hepatic steatosis again noted. Gallbladder is unremarkable. No evidence of biliary ductal dilatation.   Pancreas:  No mass or inflammatory changes.   Spleen: Within normal limits in size and appearance.   Adrenals/Urinary Tract: No masses identified. 7 mm nonobstructing calculus again seen in the midpole the left kidney. No evidence of ureteral calculi or hydronephrosis.   Stomach/Bowel: Moderate hiatal hernia again seen. No evidence of obstruction, inflammatory process or abnormal fluid collections. Normal appendix visualized.   Vascular/Lymphatic: No pathologically enlarged lymph nodes. No abdominal aortic aneurysm. Aortic atherosclerosis noted.   Reproductive: Prior hysterectomy noted. Adnexal regions are unremarkable in appearance.   Other:  None.   Musculoskeletal:  No suspicious bone lesions identified.   IMPRESSION: No acute findings within the abdomen or pelvis.   Left renal calculus. No evidence of ureteral calculi or hydronephrosis.   Stable moderate hiatal hernia.   Mild hepatic steatosis.   Aortic Atherosclerosis (ICD10-I70.0).     Electronically Signed   By: Myles Rosenthal.D.  On: 01/28/2020 15:51        Assessment & Plan:  79 year old female with a past medical history of adenomatous colon polyps, hemorrhoids, history of rectal prolapse, GERD, remote ovarian cancer, hypertension, kidney stones who is here for follow-up.  1.  Abdominal pain  --reassuring abdominal CT scan.  No alarm symptoms.  I think that with her kyphosis and protuberant abdomen she is having pain caused by compressing of her left anterior rib cage.  This is felt to be more musculoskeletal in nature.  As above we reviewed the CT scan findings together  2.  Skin tear right coccyx --benign appearing.  I recommended Desitin as a barrier cream twice daily and as well as a doughnut seat cushion while she is sitting.  3.  Constipation --chronic, continue MiraLAX and senna as needed  4.  History of adenoma of colon --2 adenomas removed in June 2018, repeat would be recommended June 2023 and based on overall health at that time  30 minutes total spent today including patient facing time, coordination of care, reviewing medical history/procedures/pertinent radiology studies, and documentation of the encounter.

## 2020-04-05 ENCOUNTER — Telehealth: Payer: Self-pay | Admitting: Internal Medicine

## 2020-04-05 NOTE — Telephone Encounter (Signed)
Grace [name] asked the pharmacist about the swelling in her feet/ankles and legs and was told she needs a stronger diuretic.   Takes hctz 25 mg daily.  Swelling is same in am and pm, she is more active, and trying to take care of herself and eating better.  We discussed sodium.  She is limiting this.   Feels her BP has gotten better with taking Toprol XL 25 mg daily.  On average, "when I'm calm like I should be" it is 147/80s.  Aware I will forward to Dr. Harrington Challenger and will call her back w any new recommendations. She was last seen in January 2021.  No follow up scheduled at this time.

## 2020-04-05 NOTE — Telephone Encounter (Signed)
New Message:    Please call, pt says she thinks she needs a stronger  diuretic.

## 2020-04-10 ENCOUNTER — Ambulatory Visit
Admission: RE | Admit: 2020-04-10 | Discharge: 2020-04-10 | Disposition: A | Discharge status: Home / Self Care | Payer: Medicare HMO | Source: Ambulatory Visit | Attending: Internal Medicine | Admitting: Internal Medicine

## 2020-04-10 ENCOUNTER — Other Ambulatory Visit: Payer: Self-pay

## 2020-04-10 DIAGNOSIS — Z1231 Encounter for screening mammogram for malignant neoplasm of breast: Secondary | ICD-10-CM

## 2020-04-13 NOTE — Telephone Encounter (Signed)
Left message for patient to call back  

## 2020-04-13 NOTE — Progress Notes (Signed)
Cardiology Office Note   Date:  04/14/2020   ID:  Yun, Gutierrez [birth year], MRN [id number]  PCP:  Karleen Hampshire., MD  Cardiologist:   Dorris Carnes, MD   Pt presents for f/u of palpitations      History of Present Illness: Grace [name] is a 79 y.o. female with a history of HTN, GERD, hypothyroidism and palpitations.  In the past she said she would wake up and notice the palpitaitons at night, not during day  Feeling like going to have "Panic attack"  Zio patch prescribed  She wore for 1 day    I saw the pt in Jan 2021  Today the pt copmaplaines of increased swelling in legs   She denies eating salty foods  Breathing is stable    SHe denies significant palpitations        Current Meds  Medication Sig  . aspirin EC 81 MG tablet Take 81 mg by mouth every other day.   . Calcium Carbonate-Vitamin D (CALCIUM + D PO) Take 2,000 mg by mouth daily.   . Cholecalciferol (VITAMIN D-3) 1000 units CAPS Take 1 capsule by mouth 5 (five) times daily.  . Coenzyme Q10 (CO Q-10) 100 MG CAPS Take 100 mg by mouth 4 (four) times a week.   . Cyanocobalamin (VITAMIN B-12) 5000 MCG TBDP Take by mouth.  . hydrochlorothiazide (HYDRODIURIL) 25 MG tablet Take 25 mg by mouth daily. (0900)  . levothyroxine (SYNTHROID, LEVOTHROID) 75 MCG tablet Take 75 mcg by mouth daily. (0800)  . LUTEIN PO Take 20 mg by mouth 4 (four) times a week.   . magnesium gluconate (MAGONATE) 500 MG tablet Take 500 mg by mouth 3 (three) times daily.   . metoprolol succinate (TOPROL XL) 25 MG 24 hr tablet Take 1 tablet (25 mg total) by mouth daily.  . Multiple Vitamin (MULTIVITAMIN) tablet Take 1 tablet by mouth daily. One-a-day  . omeprazole (PRILOSEC) 40 MG capsule TAKE ONE CAPSULE BY MOUTH DAILY  . vitamin C (ASCORBIC ACID) 500 MG tablet Take 500 mg by mouth daily.  . vitamin k 100 MCG tablet Take 100 mcg by mouth daily.     Allergies:   Zocor [simvastatin] and Amlodipine   Past Medical History:  Diagnosis Date  .  Abdominal pain   . Abdominal tenderness, unspecified site   . Adrenal nodule ([place])   . Aortic atherosclerosis ([place])   . Arthritis   . Cancer ([facility])    Ovarian  . Cholecystitis, unspecified   . Constipation    occasional  . Dysphagia, unspecified(787.20)   . Ganglion, right wrist   . GERD (gastroesophageal reflux disease)   . Hepatic steatosis   . Hiatal hernia   . Hx of ovarian cancer   . Hypertension   . Hypothyroidism   . Knee pain    BILATERAL  . Multiple lung nodules on CT   . Neoplasm of uncertain behavior of skin   . 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 Palpitations   . Primary osteoarthritis of both knees   . Rectal prolapse   . RLQ abdominal pain   . Thyroid nodule   . Trigger finger, left ring finger   . Tubular adenoma of colon   . Ventral hernia     Past Surgical History:  Procedure Laterality Date  . ABDOMINAL HYSTERECTOMY  1990's   partial  . BALLOON DILATION N/A 01/06/2017   Procedure: BALLOON DILATION;  Surgeon: Earle Gell  K, MD;  Location: [facility];  Service: Endoscopy;  Laterality: N/A;  . COLONOSCOPY WITH PROPOFOL N/A 01/06/2017   Procedure: COLONOSCOPY WITH PROPOFOL;  Surgeon: Garlan Fair, MD;  Location: [facility];  Service: Endoscopy;  Laterality: N/A;  . ESOPHAGOGASTRODUODENOSCOPY (EGD) WITH PROPOFOL N/A 01/06/2017   Procedure: ESOPHAGOGASTRODUODENOSCOPY (EGD) WITH PROPOFOL;  Surgeon: Garlan Fair, MD;  Location: [facility];  Service: Endoscopy;  Laterality: N/A;  . HERNIA REPAIR  12/2002  . OOPHORECTOMY  07/2003     Social History:  The patient  reports that she has never smoked. She has never used smokeless tobacco. She reports that she does not drink alcohol and does not use drugs.   Family History:  The patient's family history includes Heart attack in her father and mother.    ROS:  Please see the history of present illness. All other systems are reviewed and  Negative to the above problem  except as noted.    PHYSICAL EXAM: VS:  BP (!) 142/78   Pulse 85   Ht 5\' 1"  (1.549 m)   Wt 173 lb 12.8 oz (78.8 kg)   SpO2 95%   BMI 32.84 kg/m   GEN: Morbidly obese 79 year old in no acute distress  HEENT: normal  Neck: JVP is not elevated  Cardiac: RRR; no murmurs, 2+  LE edema  Respiratory:  clear to auscultation bilaterally,  GI: soft, nontender, nondistended, + BS  No hepatomegaly  MS: no deformity Moving all extremities     EKG:  EKG is rdered today.    SR 85 bpm      Lipid Panel No results found for: CHOL, TRIG, HDL, CHOLHDL, VLDL, LDLCALC, LDLDIRECT    Wt Readings from Last 3 Encounters:  04/14/20 173 lb 12.8 oz (78.8 kg)  03/22/20 172 lb (78 kg)  01/12/20 164 lb 4 oz (74.5 kg)      ASSESSMENT AND PLAN:  1  LE edema   Will set up for echo to eval LV/RV systolic / diastolic function   Check BMET, BNP, TSH today Will add Lasix 40 mg and 20 mE1 KCL  Every other day   Follow up BMET in 2 wks   2  Palpitations   Pt denies significant palpitations   Follow   3  HTN  BP is fair  Follow for now     4.  Dyslipidemia  Checklipids today   F/U in clinic in 4 t o6 wks   Current medicines are reviewed at length with the patient today.  The patient does not have concerns regarding medicines.  Signed, Dorris Carnes, MD  04/14/2020 11:13 PM    [facility] [address], [place], [place]  [address] Phone: [phone]; Fax: [phone]

## 2020-04-13 NOTE — Telephone Encounter (Signed)
Patient calling to follow up on her call from last week. She had not heard anything from Dr. Harrington Challenger or Caren Hazy about a stronger diuretic. Please advise

## 2020-04-13 NOTE — Telephone Encounter (Signed)
Patient returned my call. Scheduled patient tomorrow with Dr. Harrington Challenger to discuss worsening leg swelling.

## 2020-04-14 ENCOUNTER — Ambulatory Visit (INDEPENDENT_AMBULATORY_CARE_PROVIDER_SITE_OTHER): Payer: Medicare HMO | Admitting: Internal Medicine

## 2020-04-14 ENCOUNTER — Encounter: Payer: Self-pay | Admitting: Internal Medicine

## 2020-04-14 ENCOUNTER — Other Ambulatory Visit: Payer: Self-pay

## 2020-04-14 VITALS — BP 142/78 | HR 85 | Ht 61.0 in | Wt 173.8 lb

## 2020-04-14 DIAGNOSIS — I10 Essential (primary) hypertension: Secondary | ICD-10-CM

## 2020-04-14 DIAGNOSIS — E782 Mixed hyperlipidemia: Secondary | ICD-10-CM

## 2020-04-14 DIAGNOSIS — R609 Edema, unspecified: Secondary | ICD-10-CM | POA: Diagnosis not present

## 2020-04-14 MED ORDER — FUROSEMIDE 40 MG PO TABS: 40.0000 mg | ORAL_TABLET | ORAL | 3 refills | Status: DC

## 2020-04-14 MED ORDER — POTASSIUM CHLORIDE CRYS ER 20 MEQ PO TBCR: 20.0000 meq | EXTENDED_RELEASE_TABLET | ORAL | 3 refills | Status: DC

## 2020-04-14 NOTE — Patient Instructions (Signed)
Medication Instructions:  Your physician has recommended you make the following change in your medication:  1.) start furosemide (Lasix) 40 mg - one tablet every other day 2.) start potassium 20 meq - one tablet every other day  *If you need a refill on your cardiac medications before your next appointment, please call your pharmacy*   Lab Work: Today: bmet, bnp, tsh, lipids In two weeks: bmet  If you have labs (blood work) drawn today and your tests are completely normal, you will receive your results only by: Marland Kitchen MyChart Message (if you have MyChart) OR . A paper copy in the mail If you have any lab test that is abnormal or we need to change your treatment, we will call you to review the results.   Testing/Procedures: Your physician has requested that you have an echocardiogram. Echocardiography is a painless test that uses sound waves to create images of your heart. It provides your doctor with information about the size and shape of your heart and how well your heart's chambers and valves are working. This procedure takes approximately one hour. There are no restrictions for this procedure.  Follow-Up: At [facility], you and your health needs are our priority.  As part of our continuing mission to provide you with exceptional heart care, we have created designated Provider Care Teams.  These Care Teams include your primary Cardiologist (physician) and Advanced Practice Providers (APPs -  Physician Assistants and Nurse Practitioners) who all work together to provide you with the care you need, when you need it.  We recommend signing up for the patient portal called "MyChart".  Sign up information is provided on this After Visit Summary.  MyChart is used to connect with patients for Virtual Visits (Telemedicine).  Patients are able to view lab/test results, encounter notes, upcoming appointments, etc.  Non-urgent messages can be sent to your provider as well.   To learn more about what  you can do with MyChart, go to [email or web address].    Your next appointment:   4-6 weeks  The format for your next appointment:   In Person  Provider:   You may see Dorris Carnes, MD or one of the following Advanced Practice Providers on your designated Care Team:    Richardson Dopp, PA-C  Robbie Lis, [place]   Other Instructions

## 2020-04-15 LAB — LIPID PANEL
Chol/HDL Ratio: 4.4 ratio (ref 0.0–4.4)
Cholesterol, Total: 196 mg/dL (ref 100–199)
HDL: 45 mg/dL (ref >39)
LDL Chol Calc (NIH): 101 mg/dL — ABNORMAL HIGH (ref 0–99)
Triglycerides: 294 mg/dL — ABNORMAL HIGH (ref 0–149)
VLDL Cholesterol Cal: 50 mg/dL — ABNORMAL HIGH (ref 5–40)

## 2020-04-15 LAB — BASIC METABOLIC PANEL
BUN/Creatinine Ratio: 25 (ref 12–28)
BUN: 21 mg/dL (ref 8–27)
CO2: 28 mmol/L (ref 20–29)
Calcium: 10.6 mg/dL — ABNORMAL HIGH (ref 8.7–10.3)
Chloride: 94 mmol/L — ABNORMAL LOW (ref 96–106)
Creatinine, Ser: 0.85 mg/dL (ref 0.57–1.00)
GFR calc Af Amer: 76 mL/min/{1.73_m2} (ref >59)
GFR calc non Af Amer: 66 mL/min/{1.73_m2} (ref >59)
Glucose: 128 mg/dL — ABNORMAL HIGH (ref 65–99)
Potassium: 4.8 mmol/L (ref 3.5–5.2)
Sodium: 137 mmol/L (ref 134–144)

## 2020-04-15 LAB — PRO B NATRIURETIC PEPTIDE: NT-Pro BNP: 456 pg/mL (ref 0–738)

## 2020-04-15 LAB — TSH: TSH: 3.95 u[IU]/mL (ref 0.450–4.500)

## 2020-04-18 ENCOUNTER — Telehealth: Payer: Self-pay | Admitting: Internal Medicine

## 2020-04-18 DIAGNOSIS — R609 Edema, unspecified: Secondary | ICD-10-CM

## 2020-04-18 MED ORDER — TORSEMIDE 20 MG PO TABS: 20.0000 mg | ORAL_TABLET | ORAL | 3 refills | Status: DC

## 2020-04-18 NOTE — Telephone Encounter (Signed)
I spoke with patient and gave her information from Dr Harrington Challenger.  Will send torsemide prescription to [facility] in [place].  Patient already has lab work scheduled for October 8,2021 and would like to keep this date for follow up labs. I reviewed lab results from 9/24 with patient.  I told her plan would be to start Zetia but I did not want her to start 2 new medications at the same time due to possible allergy.  I told her we would follow up regarding Zetia and follow up labs once it was determined if she tolerated torsemide without problems.

## 2020-04-18 NOTE — Telephone Encounter (Signed)
Pt c/o medication issue:  1. Name of Medication: furosemide (LASIX) 40 MG tablet; potassium chloride SA (KLOR-CON) 20 MEQ tablet  2. How are you currently taking this medication (dosage and times per day)? As written  3. Are you having a reaction (difficulty breathing--STAT)? No   4. What is your medication issue? Severe heart pain, legs and feets turning red, bad stomach aches, headache, rash, swelling in legs and feet

## 2020-04-18 NOTE — Telephone Encounter (Signed)
Reviewed with Grace [name] Felt possibly Rxn to K But would try torsemde instead of furosemide  20 mg    No KCL Every other day for torsemide  BMET and BNP in 2 wks

## 2020-04-18 NOTE — Telephone Encounter (Signed)
Pt states she took her second dose of Furosemide and K+ yesterday around 11:30-11:45A.  Around 5pm she suddenly developed a rash, terrible stomach ache, swelling in feet and legs, and "heart pain".  Symptoms lasted about 5-10 minutes, heart pain was a little longer.  Everything has resolved except still having some stomach pain but it is much better than yesterday.  Denies trouble breathing or tongue swelling.  Had no issues with the first dose.  Advised pt I will send to Dr. Harrington Challenger for review but do not take anymore of these medications until we hear back from her.  Pt appreciative for call.

## 2020-04-18 NOTE — Telephone Encounter (Signed)
Pt says after taking second dose of furosemide she developed rash, erythema on legs   Got sick to stomach Resolved   Feels OK today  Urninating OK   She has not taken any more    She says she had never had before   Would prob consider as allergy Will review with pharmacy other options for diuretic

## 2020-04-18 NOTE — Telephone Encounter (Signed)
Reaction almost sounds more likely to be from K than furosemide. K was 4.8 in lab. Would try diuretic without KCL.  Although torsemide and furosemide are structurally similar, there is no reports of cross sensitivity. Therefore, I think its reasonable to try torsemide 20mg .

## 2020-04-19 NOTE — Telephone Encounter (Signed)
    ___________________________________________________________________________________________  [name], MD 04/16/2020 9:58 PM EDT     Electrolytes and kidney function are OK  Fluid number overall OK Thyroid function is normal  LDL is 101  With atherosclerotic plaquing in aorta it should be lower  Had bad reaction to Zocor Would add Zetia 10 mg  F/U lipids in 8-10 wks

## 2020-04-28 ENCOUNTER — Other Ambulatory Visit: Payer: Self-pay

## 2020-04-28 ENCOUNTER — Other Ambulatory Visit: Payer: Medicare HMO | Admitting: *Deleted

## 2020-04-28 ENCOUNTER — Ambulatory Visit (HOSPITAL_COMMUNITY): Payer: Medicare HMO | Attending: Cardiology

## 2020-04-28 DIAGNOSIS — E782 Mixed hyperlipidemia: Secondary | ICD-10-CM | POA: Insufficient documentation

## 2020-04-28 DIAGNOSIS — R600 Localized edema: Secondary | ICD-10-CM | POA: Diagnosis not present

## 2020-04-28 DIAGNOSIS — R609 Edema, unspecified: Secondary | ICD-10-CM

## 2020-04-28 DIAGNOSIS — I10 Essential (primary) hypertension: Secondary | ICD-10-CM | POA: Insufficient documentation

## 2020-04-29 LAB — BASIC METABOLIC PANEL
BUN/Creatinine Ratio: 30 — ABNORMAL HIGH (ref 12–28)
BUN: 29 mg/dL — ABNORMAL HIGH (ref 8–27)
CO2: 27 mmol/L (ref 20–29)
Calcium: 10.2 mg/dL (ref 8.7–10.3)
Chloride: 97 mmol/L (ref 96–106)
Creatinine, Ser: 0.97 mg/dL (ref 0.57–1.00)
GFR calc Af Amer: 65 mL/min/{1.73_m2} (ref >59)
GFR calc non Af Amer: 57 mL/min/{1.73_m2} — ABNORMAL LOW (ref >59)
Glucose: 104 mg/dL — ABNORMAL HIGH (ref 65–99)
Potassium: 4 mmol/L (ref 3.5–5.2)
Sodium: 138 mmol/L (ref 134–144)

## 2020-04-29 LAB — ECHOCARDIOGRAM COMPLETE
Area-P 1/2: 3.23 cm2
S' Lateral: 2.9 cm

## 2020-04-29 LAB — PRO B NATRIURETIC PEPTIDE: NT-Pro BNP: 267 pg/mL (ref 0–738)

## 2020-05-05 ENCOUNTER — Telehealth: Payer: Self-pay | Admitting: Internal Medicine

## 2020-05-05 ENCOUNTER — Telehealth: Payer: Self-pay

## 2020-05-05 NOTE — Telephone Encounter (Signed)
Pt advised her lab results and verbalized understanding she says that she has been feeling well.. she reports that she fee;s bets on the days she takes her Torsemide.. currently taking QOD.Marland Kitchen she is asking if she can increase to daily.. will forward to DR. Ross for review.  Pt says her edema is staying down but feels "puffier" on the days she does not take it same with her breathing. She plans to continue to watch her sodium intake and elevate her extremities when sitting.   *Pt is asking for her lab and echo results to be mailed to her... will forward to the [facility].

## 2020-05-05 NOTE — Telephone Encounter (Signed)
Pt called and said that she would really like to know what her results are. Please call

## 2020-05-05 NOTE — Telephone Encounter (Signed)
Patient has follow up on 05/12/20 with APP.

## 2020-05-05 NOTE — Telephone Encounter (Signed)
-----   Message from [name], MD sent at 05/02/2020  9:23 AM EDT ----- Electrolytes and kidney function are OK Fluid number is OK How is patient feeling?

## 2020-05-05 NOTE — Telephone Encounter (Signed)
Details on other phone encounter dated 05/05/20.

## 2020-05-09 ENCOUNTER — Telehealth: Payer: Self-pay | Admitting: Internal Medicine

## 2020-05-09 MED ORDER — TORSEMIDE 20 MG PO TABS
20.0000 mg | ORAL_TABLET | ORAL | 3 refills | Status: DC
Start: 2020-05-09 — End: 2020-06-19

## 2020-05-09 NOTE — Telephone Encounter (Signed)
*  STAT* If patient is at the pharmacy, call can be transferred to refill team.   1. Which medications need to be refilled? (please list name of each medication and dose if known) need a new prescription for Torsemide  2. Which pharmacy/location (including street and city if local pharmacy) is medication to be sent to? [place], [place]  3. Do they need a 30 day or 90 day supply? 90 days and refills

## 2020-05-09 NOTE — Telephone Encounter (Signed)
Pt's medication was sent to pt's pharmacy as requested. Confirmation received.  °

## 2020-05-12 ENCOUNTER — Ambulatory Visit: Payer: Medicare HMO | Admitting: Physician Assistant

## 2020-05-15 ENCOUNTER — Telehealth: Payer: Self-pay

## 2020-05-15 NOTE — Telephone Encounter (Signed)
**Note De-Identified Ceferino Lang Obfuscation** Letter received from [facility] stating that they have approved the pts Torsemide tier exception. Approval is valid until 07/21/2020  I have notified the pts pharmacy of this approval.

## 2020-05-16 NOTE — Telephone Encounter (Signed)
Patient following up on lab and echo results that were to be mailed to her. She states she still has not received them and wants to know if they have been sent out yet. Address in Epic confirmed.   Also, patient states that during the echo, the tech went up around her neck and throat. She thinks this was to check for any blockages in her arteries. She wants to know if anything was found. Please advise.

## 2020-05-17 NOTE — Telephone Encounter (Signed)
Patient requesting copy of echo and lab results be mailed to her.

## 2020-05-17 NOTE — Telephone Encounter (Signed)
Left message for patient to call back  

## 2020-06-18 NOTE — Progress Notes (Signed)
Cardiology Office Note   Date:  06/19/2020   ID:  Grace [name], DOB [birth year], MRN [id number]  PCP:  Karleen Hampshire., MD  Cardiologist:   Dorris Carnes, MD   Pt presents for f/u of LE edema    History of Present Illness: Grace [name] is a 79 y.o. female with a history of HTN, GERD, hypothyroidism and palpitations.   I saw the pt in Sept 2021  At that time some complained of increased LE swelling  Labs drawn.   I recomm diuretic every other day  Echo also ordered   This showed LVEF was normal   Since seen she has been OK   The pt says the days she takes the torsemide her ankles improve     Breathing is OK  No CP    No palpitations     Current Meds  Medication Sig  . aspirin EC 81 MG tablet Take 81 mg by mouth every other day.   . Calcium Carbonate-Vitamin D (CALCIUM + D PO) Take 2,000 mg by mouth daily.   . Cholecalciferol (VITAMIN D-3) 1000 units CAPS Take 1 capsule by mouth 5 (five) times daily.  . Coenzyme Q10 (CO Q-10) 100 MG CAPS Take 100 mg by mouth 4 (four) times a week.   . Cyanocobalamin (VITAMIN B-12) 5000 MCG TBDP Take by mouth.  . hydrochlorothiazide (HYDRODIURIL) 25 MG tablet Take 25 mg by mouth daily. (0900)  . levothyroxine (SYNTHROID, LEVOTHROID) 75 MCG tablet Take 75 mcg by mouth daily. (0800)  . LUTEIN PO Take 20 mg by mouth 4 (four) times a week.   . magnesium gluconate (MAGONATE) 500 MG tablet Take 500 mg by mouth 3 (three) times daily.   . metoprolol succinate (TOPROL XL) 25 MG 24 hr tablet Take 1 tablet (25 mg total) by mouth daily.  . Multiple Vitamin (MULTIVITAMIN) tablet Take 1 tablet by mouth daily. One-a-day  . omeprazole (PRILOSEC) 40 MG capsule TAKE ONE CAPSULE BY MOUTH DAILY  . torsemide (DEMADEX) 20 MG tablet Take 1 tablet (20 mg total) by mouth daily.  . vitamin C (ASCORBIC ACID) 500 MG tablet Take 500 mg by mouth daily.  . vitamin k 100 MCG tablet Take 100 mcg by mouth daily.  . [DISCONTINUED] torsemide (DEMADEX) 20 MG tablet Take 1  tablet (20 mg total) by mouth every other day.     Allergies:   Zocor [simvastatin], Amlodipine, Drug class [potassium-containing compounds], and Furosemide   Past Medical History:  Diagnosis Date  . Abdominal pain   . Abdominal tenderness, unspecified site   . Adrenal nodule ([place])   . Aortic atherosclerosis ([place])   . Arthritis   . Cancer (HCC)    Ovarian  . Cholecystitis, unspecified   . Constipation    occasional  . Dysphagia, unspecified(787.20)   . Ganglion, right wrist   . GERD (gastroesophageal reflux disease)   . Hepatic steatosis   . Hiatal hernia   . Hx of ovarian cancer   . Hypertension   . Hypothyroidism   . Knee pain    BILATERAL  . Multiple lung nodules on CT   . Neoplasm of uncertain behavior of skin   . 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 Palpitations   . Primary osteoarthritis of both knees   . Rectal prolapse   . RLQ abdominal pain   . Thyroid nodule   . Trigger finger, left ring finger   . Tubular adenoma of colon   .  Ventral hernia     Past Surgical History:  Procedure Laterality Date  . ABDOMINAL HYSTERECTOMY  1990's   partial  . BALLOON DILATION N/A 01/06/2017   Procedure: BALLOON DILATION;  Surgeon: Garlan Fair, MD;  Location: [facility];  Service: Endoscopy;  Laterality: N/A;  . COLONOSCOPY WITH PROPOFOL N/A 01/06/2017   Procedure: COLONOSCOPY WITH PROPOFOL;  Surgeon: Garlan Fair, MD;  [facility];  Service: Endoscopy;  Laterality: N/A;  . ESOPHAGOGASTRODUODENOSCOPY (EGD) WITH PROPOFOL N/A 01/06/2017   Procedure: ESOPHAGOGASTRODUODENOSCOPY (EGD) WITH PROPOFOL;  Surgeon: Garlan Fair, MD;  [facility];  Service: Endoscopy;  Laterality: N/A;  . HERNIA REPAIR  12/2002  . OOPHORECTOMY  07/2003     Social History:  The patient  reports that she has never smoked. She has never used smokeless tobacco. She reports that she does not drink alcohol and does not use drugs.   Family  History:  The patient's family history includes Heart attack in her father and mother.    ROS:  Please see the history of present illness. All other systems are reviewed and  Negative to the above problem except as noted.    PHYSICAL EXAM: VS:  BP (!) 174/96   Pulse 98   Ht 5\' 1"  (1.549 m)   Wt 174 lb 9.6 oz (79.2 kg)   SpO2 95%   BMI 32.99 kg/m   GEN: Morbidly obese 79 year old in no acute distress  HEENT: normal  Neck: JVP is not elevated  Cardiac: RRR; no murmurs, 1+ LE edema  Respiratory:  clear to auscultation bilaterally,  GI: soft, nontender, nondistended, + BS  No hepatomegaly  MS: no deformity Moving all extremities     EKG:  EKG is rdered today.    SR 85 bpm     Echo  04/28/20 Left ventricular ejection fraction, by estimation, is 60 to 65%. The left ventricle has normal function. The left ventricle has no regional wall motion abnormalities. There is mild left ventricular hypertrophy. Left ventricular diastolic parameters are indeterminate. 2. Right ventricular systolic function is normal. The right ventricular size is normal. Tricuspid regurgitation signal is inadequate for assessing PA pressure. 3. Left atrial size was mildly dilated. 4. The mitral valve is normal in structure. Trivial mitral valve regurgitation. 5. The aortic valve is tricuspid. Aortic valve regurgitation is not visualized. Mild aortic valve sclerosis is present, with no evidence of aortic valve stenosis. 6. The inferior vena cava is normal in size with greater than 50% respiratory variability, suggesting right atrial pressure of 3 mmHg.  Lipid Panel    Component Value Date/Time   CHOL 196 04/14/2020 1454   TRIG 294 (H) 04/14/2020 1454   HDL 45 04/14/2020 1454   CHOLHDL 4.4 04/14/2020 1454   LDLCALC 101 (H) 04/14/2020 1454      Wt Readings from Last 3 Encounters:  06/19/20 174 lb 9.6 oz (79.2 kg)  04/14/20 173 lb 12.8 oz (78.8 kg)  03/22/20 172 lb (78 kg)      ASSESSMENT AND  PLAN:  1  LE edema   Improved but still presetn   Will increase diuretic to daily  Check labs next week   Add K to regimen Overall appears relatively comfortable  Watch salt intake   LImit  2  Palpitations   Pt denies significant palpitations Continue to follow   3  HTN  BP is high today   Would go to daily diuretic   WIll need f/u  4.  Dyslipidemia     Current medicines are reviewed at length with the patient today.  The patient does not have concerns regarding medicines.  Signed, Dorris Carnes, MD  06/19/2020 11:28 PM    [address], [place], [place]  [address] Phone: [phone]; Fax: [phone]

## 2020-06-19 ENCOUNTER — Ambulatory Visit (INDEPENDENT_AMBULATORY_CARE_PROVIDER_SITE_OTHER): Payer: Medicare HMO | Admitting: Internal Medicine

## 2020-06-19 ENCOUNTER — Encounter: Payer: Self-pay | Admitting: Internal Medicine

## 2020-06-19 ENCOUNTER — Other Ambulatory Visit: Payer: Self-pay

## 2020-06-19 VITALS — BP 174/96 | HR 98 | Ht 61.0 in | Wt 174.6 lb

## 2020-06-19 DIAGNOSIS — I10 Essential (primary) hypertension: Secondary | ICD-10-CM

## 2020-06-19 DIAGNOSIS — I5043 Acute on chronic combined systolic (congestive) and diastolic (congestive) heart failure: Secondary | ICD-10-CM | POA: Diagnosis not present

## 2020-06-19 MED ORDER — TORSEMIDE 20 MG PO TABS
20.0000 mg | ORAL_TABLET | Freq: Every day | ORAL | 3 refills | Status: DC
Start: 2020-06-19 — End: 2021-06-08

## 2020-06-19 MED ORDER — POTASSIUM CHLORIDE ER 10 MEQ PO TBCR: 10.0000 meq | EXTENDED_RELEASE_TABLET | Freq: Every day | ORAL | 3 refills | Status: DC

## 2020-06-19 NOTE — Patient Instructions (Signed)
Medication Instructions:  Your physician has recommended you make the following change in your medication:  1.) increase torsemide to 20 mg DAILY 2.) start potassium chloride 10 meq DAILY  *If you need a refill on your cardiac medications before your next appointment, please call your pharmacy*   Lab Work: To be done at PCP office - results to be faxed to Dr. Harrington Challenger.  If you have labs (blood work) drawn today and your tests are completely normal, you will receive your results only by: Marland Kitchen MyChart Message (if you have MyChart) OR . A paper copy in the mail If you have any lab test that is abnormal or we need to change your treatment, we will call you to review the results.   Testing/Procedures: none   Follow-Up: At [facility], you and your health needs are our priority.  As part of our continuing mission to provide you with exceptional heart care, we have created designated Provider Care Teams.  These Care Teams include your primary Cardiologist (physician) and Advanced Practice Providers (APPs -  Physician Assistants and Nurse Practitioners) who all work together to provide you with the care you need, when you need it.  We recommend signing up for the patient portal called "MyChart".  Sign up information is provided on this After Visit Summary.  MyChart is used to connect with patients for Virtual Visits (Telemedicine).  Patients are able to view lab/test results, encounter notes, upcoming appointments, etc.  Non-urgent messages can be sent to your provider as well.   To learn more about what you can do with MyChart, go to [email or web address].    Your next appointment:   4 month(s)  The format for your next appointment:   In Person  Provider:   You may see Dorris Carnes, MD or one of the following Advanced Practice Providers on your designated Care Team:    Richardson Dopp, PA-C  Robbie Lis, [place]    Other Instructions

## 2020-08-24 ENCOUNTER — Other Ambulatory Visit: Payer: Self-pay | Admitting: Internal Medicine

## 2020-10-26 ENCOUNTER — Telehealth: Payer: Self-pay | Admitting: Internal Medicine

## 2020-10-26 NOTE — Telephone Encounter (Signed)
Letter mailed to pt as requested.

## 2020-10-26 NOTE — Telephone Encounter (Signed)
Patient called and is requesting a letter indicating she saw Dr. Hilarie Fredrickson last year along with the date which is 03/22/20 to be mailed to her address.

## 2021-02-19 ENCOUNTER — Other Ambulatory Visit: Payer: Self-pay

## 2021-02-19 ENCOUNTER — Encounter: Payer: Self-pay | Admitting: Orthopedic Surgery

## 2021-02-19 ENCOUNTER — Ambulatory Visit (INDEPENDENT_AMBULATORY_CARE_PROVIDER_SITE_OTHER): Payer: Medicare HMO

## 2021-02-19 ENCOUNTER — Ambulatory Visit: Payer: Medicare HMO | Admitting: Orthopedic Surgery

## 2021-02-19 VITALS — Ht 62.0 in | Wt 162.0 lb

## 2021-02-19 DIAGNOSIS — M25562 Pain in left knee: Secondary | ICD-10-CM

## 2021-02-19 DIAGNOSIS — M1712 Unilateral primary osteoarthritis, left knee: Secondary | ICD-10-CM

## 2021-02-20 ENCOUNTER — Encounter: Payer: Self-pay | Admitting: Orthopedic Surgery

## 2021-02-20 NOTE — Progress Notes (Signed)
Office Visit Note   Patient: Grace [name]           Date of Birth: Mar 11, 1941           MRN: [id number] Visit Date: 02/19/2021              Requested by: Karleen Hampshire., MD [id number] [place],  [place] [address] PCP: Karleen Hampshire., MD  Chief Complaint  Patient presents with   Left Knee - Pain      HPI: Patient is a 80 year old woman who presents for evaluation of end-stage osteoarthritis of her left knee patient states that she has to use 2 canes or a rolling walker to walk.  She complains of swelling she states she can barely move her knee.  Patient states that about 2 weeks ago she went to bed and woke up the next morning and could not walk she has been using Tylenol without relief.  She is not diabetic.  Assessment & Plan: Visit Diagnoses:  1. Acute pain of left knee   2. Unilateral primary osteoarthritis, left knee     Plan: Discussed that patient could achieve temporary relief with a steroid injection do not think that hyaluronic acid injections would help discussed that her best option would be to proceed with a total knee arthroplasty.  Patient states she does not want to proceed with any injections at this time she will think about total knee replacement and will call if she wishes to proceed with surgery.  Follow-Up Instructions: Return if symptoms worsen or fail to improve.   Ortho Exam  Patient is alert, oriented, no adenopathy, well-dressed, normal affect, normal respiratory effort. Examination patient has about 45 degrees range of motion of the left knee there is crepitation with range of motion she is tender to palpation of the medial and lateral joint line there is an effusion collaterals are cruciates are stable.  There is no redness no cellulitis no signs of infection.  She has varus alignment with ambulation.  Imaging: XR Knee 1-2 Views Left  Result Date: 02/20/2021 2 view radiographs of the left knee shows advanced arthritic changes with  multiple loose bodies periarticular bony spurs in all 3 compartments varus alignment with bone-on-bone contact of the medial joint line with subcondylar cysts and sclerosis.  No images are attached to the encounter.  Labs: No results found for: HGBA1C, ESRSEDRATE, CRP, LABURIC, REPTSTATUS, GRAMSTAIN, CULT, LABORGA   Lab Results  Component Value Date   ALBUMIN 4.4 01/12/2020    No results found for: MG No results found for: VD25OH  No results found for: PREALBUMIN CBC EXTENDED Latest Ref Rng & Units 01/12/2020  WBC 4.0 - 10.5 K/uL 9.1  RBC 3.87 - 5.11 Mil/uL 4.35  HGB 12.0 - 15.0 g/dL 12.1  HCT 36.0 - 46.0 % 35.5(L)  PLT 150.0 - 400.0 K/uL 221.0  NEUTROABS 1.4 - 7.7 K/uL 6.7  LYMPHSABS 0.7 - 4.0 K/uL 1.1     Body mass index is 29.63 kg/m.  Orders:  Orders Placed This Encounter  Procedures   XR Knee 1-2 Views Left   No orders of the defined types were placed in this encounter.    Procedures: No procedures performed  Clinical Data: No additional findings.  ROS:  All other systems negative, except as noted in the HPI. Review of Systems  Objective: Vital Signs: Ht '5\' 2"'$  (1.575 m)   Wt 162 lb (73.5 kg)   BMI 29.63 kg/m  Specialty Comments:  No specialty comments available.  PMFS History: Patient Active Problem List   Diagnosis Date Noted   Generalized abdominal pain 01/13/2020   Coccydynia 01/13/2020   Bilateral primary osteoarthritis of knee 06/21/2016   Abdominal pain of unknown etiology 05/07/2012   Past Medical History:  Diagnosis Date   Abdominal pain    Abdominal tenderness, unspecified site    Adrenal nodule (HCC)    Aortic atherosclerosis (HCC)    Arthritis    Cancer ([place])    Ovarian   Cholecystitis, unspecified    Constipation    occasional   Dysphagia, unspecified(787.20)    Ganglion, right wrist    GERD (gastroesophageal reflux disease)    Hepatic steatosis    Hiatal hernia    Hx of ovarian cancer    Hypertension     Hypothyroidism    Knee pain    BILATERAL   Multiple lung nodules on CT    Neoplasm of uncertain behavior of skin    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    Palpitations    Primary osteoarthritis of both knees    Rectal prolapse    RLQ abdominal pain    Thyroid nodule    Trigger finger, left ring finger    Tubular adenoma of colon    Ventral hernia     Family History  Problem Relation Age of Onset   Heart attack Mother    Heart attack Father    Breast cancer Neg Hx     Past Surgical History:  Procedure Laterality Date   ABDOMINAL HYSTERECTOMY  1990's   partial   BALLOON DILATION N/A 01/06/2017   Procedure: BALLOON DILATION;  Surgeon: Garlan Fair, MD;  [facility];  Service: Endoscopy;  Laterality: N/A;   COLONOSCOPY WITH PROPOFOL N/A 01/06/2017   Procedure: COLONOSCOPY WITH PROPOFOL;  Surgeon: Garlan Fair, MD;  [facility];  Service: Endoscopy;  Laterality: N/A;   ESOPHAGOGASTRODUODENOSCOPY (EGD) WITH PROPOFOL N/A 01/06/2017   Procedure: ESOPHAGOGASTRODUODENOSCOPY (EGD) WITH PROPOFOL;  Surgeon: Garlan Fair, MD;  [facility];  Service: Endoscopy;  Laterality: N/A;   HERNIA REPAIR  12/2002   OOPHORECTOMY  07/2003   Social History   Occupational History   Occupation: Retired  Tobacco Use   Smoking status: Never   Smokeless tobacco: Never  Vaping Use   Vaping Use: Never used  Substance and Sexual Activity   Alcohol use: No   Drug use: No   Sexual activity: Not Currently

## 2021-03-16 ENCOUNTER — Other Ambulatory Visit: Payer: Self-pay | Admitting: Internal Medicine

## 2021-03-16 DIAGNOSIS — Z1231 Encounter for screening mammogram for malignant neoplasm of breast: Secondary | ICD-10-CM

## 2021-06-07 ENCOUNTER — Ambulatory Visit (INDEPENDENT_AMBULATORY_CARE_PROVIDER_SITE_OTHER): Payer: Medicare HMO

## 2021-06-07 ENCOUNTER — Ambulatory Visit: Payer: Medicare HMO | Admitting: Orthopedic Surgery

## 2021-06-07 ENCOUNTER — Other Ambulatory Visit: Payer: Self-pay

## 2021-06-07 DIAGNOSIS — M79642 Pain in left hand: Secondary | ICD-10-CM

## 2021-06-07 MED ORDER — DOXYCYCLINE HYCLATE 100 MG PO TABS: 100.0000 mg | ORAL_TABLET | Freq: Two times a day (BID) | ORAL | 0 refills | Status: AC

## 2021-06-08 ENCOUNTER — Other Ambulatory Visit: Payer: Self-pay | Admitting: Internal Medicine

## 2021-06-25 ENCOUNTER — Encounter: Payer: Self-pay | Admitting: Orthopedic Surgery

## 2021-06-25 NOTE — Progress Notes (Signed)
Office Visit Note   Patient: Grace [name]           Date of Birth: [birth year]           MRN: [id number] Visit Date: 06/07/2021              Requested by: Karleen Hampshire., MD [address] [place],  [place] [address] PCP: Karleen Hampshire., MD  Chief Complaint  Patient presents with   Left Hand - Pain      HPI: Patient is a 80 year old woman who presents complaining of acute pain in her hands.  Pain and swelling worse in the long and ring finger.  Patient complains of more swelling in the ring finger.  Patient denies any injuries or falls.  Assessment & Plan: Visit Diagnoses:  1. Pain in left hand     Plan: Recommend Voltaren gel and paraffin wax treatment.  A refill prescription was provided for the doxycycline.  Patient has decreased range of motion of all fingers.  Follow-Up Instructions: Return in about 4 weeks (around 07/05/2021).   Ortho Exam  Patient is alert, oriented, no adenopathy, well-dressed, normal affect, normal respiratory effort. Examination the redness and swelling of the left ring finger is resolving there is no cellulitis no tenderness to palpation radiographs show advanced arthritic destructive bony changes.  Imaging: No results found. No images are attached to the encounter.  Labs: No results found for: HGBA1C, ESRSEDRATE, CRP, LABURIC, REPTSTATUS, GRAMSTAIN, CULT, LABORGA   Lab Results  Component Value Date   ALBUMIN 4.4 01/12/2020    No results found for: MG No results found for: VD25OH  No results found for: PREALBUMIN CBC EXTENDED Latest Ref Rng & Units 01/12/2020  WBC 4.0 - 10.5 K/uL 9.1  RBC 3.87 - 5.11 Mil/uL 4.35  HGB 12.0 - 15.0 g/dL 12.1  HCT 36.0 - 46.0 % 35.5(L)  PLT 150.0 - 400.0 K/uL 221.0  NEUTROABS 1.4 - 7.7 K/uL 6.7  LYMPHSABS 0.7 - 4.0 K/uL 1.1     There is no height or weight on file to calculate BMI.  Orders:  Orders Placed This Encounter  Procedures   XR Hand Complete Left   Meds ordered this  encounter  Medications   doxycycline (VIBRA-TABS) 100 MG tablet    Sig: Take 1 tablet (100 mg total) by mouth 2 (two) times daily.    Dispense:  20 tablet    Refill:  0     Procedures: No procedures performed  Clinical Data: No additional findings.  ROS:  All other systems negative, except as noted in the HPI. Review of Systems  Objective: Vital Signs: There were no vitals taken for this visit.  Specialty Comments:  No specialty comments available.  PMFS History: Patient Active Problem List   Diagnosis Date Noted   Generalized abdominal pain 01/13/2020   Coccydynia 01/13/2020   Bilateral primary osteoarthritis of knee 06/21/2016   Abdominal pain of unknown etiology 05/07/2012   Past Medical History:  Diagnosis Date   Abdominal pain    Abdominal tenderness, unspecified site    Adrenal nodule (HCC)    Aortic atherosclerosis (HCC)    Arthritis    Cancer ([place])    Ovarian   Cholecystitis, unspecified    Constipation    occasional   Dysphagia, unspecified(787.20)    Ganglion, right wrist    GERD (gastroesophageal reflux disease)    Hepatic steatosis    Hiatal hernia    Hx of ovarian cancer  Hypertension    Hypothyroidism    Knee pain    BILATERAL   Multiple lung nodules on CT    Neoplasm of uncertain behavior of skin    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    Palpitations    Primary osteoarthritis of both knees    Rectal prolapse    RLQ abdominal pain    Thyroid nodule    Trigger finger, left ring finger    Tubular adenoma of colon    Ventral hernia     Family History  Problem Relation Age of Onset   Heart attack Mother    Heart attack Father    Breast cancer Neg Hx     Past Surgical History:  Procedure Laterality Date   ABDOMINAL HYSTERECTOMY  1990's   partial   BALLOON DILATION N/A 01/06/2017   Procedure: BALLOON DILATION;  Surgeon: Garlan Fair, MD;  [facility];  Service: Endoscopy;  Laterality: N/A;    COLONOSCOPY WITH PROPOFOL N/A 01/06/2017   Procedure: COLONOSCOPY WITH PROPOFOL;  Surgeon: Garlan Fair, MD;  [facility];  Service: Endoscopy;  Laterality: N/A;   ESOPHAGOGASTRODUODENOSCOPY (EGD) WITH PROPOFOL N/A 01/06/2017   Procedure: ESOPHAGOGASTRODUODENOSCOPY (EGD) WITH PROPOFOL;  Surgeon: Garlan Fair, MD;  [facility];  Service: Endoscopy;  Laterality: N/A;   HERNIA REPAIR  12/2002   OOPHORECTOMY  07/2003   Social History   Occupational History   Occupation: Retired  Tobacco Use   Smoking status: Never   Smokeless tobacco: Never  Vaping Use   Vaping Use: Never used  Substance and Sexual Activity   Alcohol use: No   Drug use: No   Sexual activity: Not Currently

## 2021-07-12 ENCOUNTER — Other Ambulatory Visit: Payer: Self-pay | Admitting: Internal Medicine

## 2021-09-10 ENCOUNTER — Other Ambulatory Visit: Payer: Self-pay | Admitting: Internal Medicine

## 2021-12-21 ENCOUNTER — Encounter: Payer: Self-pay | Admitting: *Deleted

## 2021-12-26 ENCOUNTER — Encounter: Payer: Self-pay | Admitting: Internal Medicine

## 2021-12-26 ENCOUNTER — Ambulatory Visit: Payer: Medicare HMO | Admitting: Internal Medicine

## 2021-12-26 VITALS — HR 97 | Ht 61.0 in | Wt 158.0 lb

## 2021-12-26 DIAGNOSIS — K648 Other hemorrhoids: Secondary | ICD-10-CM | POA: Diagnosis not present

## 2021-12-26 DIAGNOSIS — K5909 Other constipation: Secondary | ICD-10-CM

## 2021-12-26 DIAGNOSIS — Z860100 Personal history of colon polyps, unspecified: Secondary | ICD-10-CM

## 2021-12-26 DIAGNOSIS — K219 Gastro-esophageal reflux disease without esophagitis: Secondary | ICD-10-CM

## 2021-12-26 MED ORDER — CLOTRIMAZOLE-BETAMETHASONE 1-0.05 % EX CREA: 1.0000 "application " | TOPICAL_CREAM | Freq: Two times a day (BID) | CUTANEOUS | 0 refills | Status: AC

## 2021-12-26 NOTE — Patient Instructions (Addendum)
If you are age 79 or older, your body mass index should be between 23-30. Your Body mass index is 29.85 kg/m. If this is out of the aforementioned range listed, please consider follow up with your Primary Care Provider.  If you are age 17 or younger, your body mass index should be between 19-25. Your Body mass index is 29.85 kg/m. If this is out of the aformentioned range listed, please consider follow up with your Primary Care Provider.   ________________________________________________________  The [facility] providers would like to encourage you to use [facility] to communicate with providers for non-urgent requests or questions.  Due to long hold times on the telephone, sending your provider a message by [facility] may be a faster and more efficient way to get a response.  Please allow 48 business hours for a response.  Please remember that this is for non-urgent requests.  _______________________________________________________  Stay on Omeprazole '40mg'$   Stay on Miralax and Senna.  We have sent the following medications to your pharmacy for you to pick up at your convenience:  Lotrisone - apply twice a day for a week and then stop

## 2021-12-26 NOTE — Progress Notes (Signed)
Subjective:    Patient ID: Grace [name], female    DOB: Aug 09, 1940, 81 y.o.   MRN: [id number]  HPI Grace [name] is a 81 year old female with a history of adenomatous colon polyps, hemorrhoids, history of rectal prolapse, GERD, remote ovarian cancer, hypertension, kidney stones who is here for follow-up.  I last saw her in September 2021 and she is here today alone.  She reports that she has been having upper and left upper abdominal pain.  This was sharp in nature and radiating to her right upper back.  She reports that on the way here she had to stop for urination and passed a kidney stone.  Since doing this the pain has completely resolved.  She reports she is eating well without nausea or vomiting.  Her reflux is in good control.  She denies dysphagia and odynophagia.  She had an episode of red blood per rectum which she attributes to starting fenofibrate for cholesterol.  She reports that this did not cause constipation or diarrhea but she does recall straining with the stool on 1 day and that is the day that she saw the red blood.  There was some mild stinging or burning during that bowel movement.  It has subsequently resolved though she did stop the fenofibrate.  She seen no further blood in stool or melena.  No blood with wiping.  She is using MiraLAX daily and occasionally senna when she has issues with constipation.  Her last colonoscopy was performed by Dr. Wynetta Emery in June 2018.  This was a complete exam with a good prep.  2 polyps were removed the largest being 6 mm.  The exam was otherwise normal.  The polyp was adenomatous.  Review of Systems As per HPI, otherwise negative  Current Medications, Allergies, Past Medical History, Past Surgical History, Family History and Social History were reviewed in [employer] record.    Objective:   Physical Exam Pulse 97   Ht '5\' 1"'$  (1.549 m)   Wt 158 lb (71.7 kg)   SpO2 98%   BMI 29.85 kg/m  Gen: awake, alert,  NAD HEENT: anicteric CV: RRR, no mrg Pulm: CTA b/l Abd: soft, obese, diastases recti, NT/ND, +BS throughout Rectal: Mild perianal skin erythema no raised lesions or ulceration, visible anal skin hypertrophy, visible prolapse internal hemorrhoid, soft brown heme-negative stool in the vault, no internal masses Ext: no c/c/e Neuro: nonfocal  Labs reviewed by care everywhere dated February 2023 Hemoglobin 12.3, MCV 81.2, platelet count 268 AST 14, ALT 9, total bilirubin 0.5, alkaline phosphatase 84 Albumin 4.9      Assessment & Plan:   81 year old female with a history of adenomatous colon polyps, hemorrhoids, history of rectal prolapse, GERD, remote ovarian cancer, hypertension, kidney stones who is here for follow-up.   Internal hemorrhoids with bleeding --very likely the patient's red blood per rectum are related to internal hemorrhoids though I cannot exclude fissure.  Benign rectal exam today.  I do not think this relates to her fenofibrate and I encouraged her to resume this or at least discuss the fact that she has stopped this with primary care.  I asked her to notify me if she has further issues with bleeding internal hemorrhoids.  2.  Perianal tinea --Lotrisone twice daily for a week  3.  Chronic constipation --Merril Abbe is working well she can continue 17 g daily.  Senna can be used as needed  4.  History of GERD --well-controlled without alarm symptoms on omeprazole.  Continue 40 mg daily  5.  History of nonadvanced adenoma of the colon --we discussed how guidelines have changed since she was seen here last.  She had 2 subcentimeter polyps with a good prep complete colonoscopy in June 2018.  Guidelines would support surveillance at 7 years which would be June 2025.  We discussed this today.  Given that she will be 81 years old at that time she will likely not benefit from repeat colonoscopy for surveillance based on age.  I explained that this does not mean we would not consider  repeating colonoscopy in the event of troublesome GI symptoms in the future.  After this discussion she wishes to discontinue surveillance colonoscopy based on age which is very reasonable.  30 minutes total spent today including patient facing time, coordination of care, reviewing medical history/procedures/pertinent radiology studies, and documentation of the encounter.

## 2022-01-04 ENCOUNTER — Telehealth: Payer: Self-pay | Admitting: Internal Medicine

## 2022-01-04 NOTE — Telephone Encounter (Addendum)
Inbound call from patient stating that she was seen by Dr. Hilarie Fredrickson on 6/5 and Is still having issues. Patient thinks that she may need a CT. Patient is requesting a call back to discuss with the nurse. Please advise.

## 2022-01-07 NOTE — Telephone Encounter (Signed)
If she needs to have any type of imaging would like to go [facility]-her car is broken and she is needing the closest to her home as possible.

## 2022-01-07 NOTE — Telephone Encounter (Signed)
Pt states she is having the same pain she was having when she was seen last. Reports she is having pain at the top of her stomach and on her left side that goes around to her back. Pt states she isn't sure if perhaps she is trying to pass kidney stones. Discussed with her that Dr. Hilarie Fredrickson is out of the office this week and she might want to contact her urologist but pt states she wants to wait to hear from Dr. Hilarie Fredrickson. She is wondering if she needs some imaging done. Please advise.

## 2022-01-16 ENCOUNTER — Other Ambulatory Visit: Payer: Self-pay

## 2022-01-16 DIAGNOSIS — R1032 Left lower quadrant pain: Secondary | ICD-10-CM

## 2022-01-16 NOTE — Telephone Encounter (Signed)
Left detailed message on pts personal cell phone. Scan orders in epic. Radiology scheduling to contact pt to set up CT appt.

## 2022-01-23 NOTE — Addendum Note (Signed)
Addended by: Yevette [name] on: 01/23/2022 01:15 PM   Modules accepted: Orders

## 2022-01-23 NOTE — Telephone Encounter (Signed)
Called and spoke with patient regarding her concerns. Pt wanted to know what the CT was ordered for. I told her that Dr. Hilarie Fredrickson recommended a repeat CT scan to evaluate her LUQ pain. Pt requested that order be changed to [place]. Pt wanted to know if she could come early for her CT appt and drink the contrast when she gets to GI. I told pt that she will need to check with GI to see if that is OK with them. Pt knows that she will receive a call from GI to set up her appt.  Pt verbalized understanding and had no concerns at the end of the call.  CT order location changed to GI. Secure staff message sent to [facility] to let her know.

## 2022-01-23 NOTE — Telephone Encounter (Signed)
Patient called stating that Radiology had called her to set up an appointment for a CT and go over what she is to do for test. Patient stated that she did not understand what they were trying to tell her. Patient is requesting a call back to discuss. Please advise.

## 2022-02-21 ENCOUNTER — Ambulatory Visit
Admission: RE | Admit: 2022-02-21 | Discharge: 2022-02-21 | Disposition: A | Discharge status: Home / Self Care | Payer: Medicare HMO | Source: Ambulatory Visit | Attending: Internal Medicine | Admitting: Internal Medicine

## 2022-02-21 DIAGNOSIS — R1032 Left lower quadrant pain: Secondary | ICD-10-CM

## 2022-02-21 MED ORDER — IOPAMIDOL (ISOVUE-300) INJECTION 61%
100.0000 mL | Freq: Once | INTRAVENOUS | Status: AC | PRN
  Administered 2022-02-21: 100 mL via INTRAVENOUS

## 2022-09-24 ENCOUNTER — Ambulatory Visit: Payer: Medicare HMO | Admitting: Internal Medicine

## 2022-09-27 ENCOUNTER — Encounter: Payer: Self-pay | Admitting: General Practice

## 2024-01-20 DEATH — deceased
